# Patient Record
Sex: Female | Born: 1965
Health system: Southern US, Community
[De-identification: ages and names within clinical notes are randomized; demographics above are authoritative.]

## PROBLEM LIST (undated history)

## (undated) DIAGNOSIS — I10 Essential (primary) hypertension: Secondary | ICD-10-CM

## (undated) DIAGNOSIS — N9489 Other specified conditions associated with female genital organs and menstrual cycle: Secondary | ICD-10-CM

## (undated) DIAGNOSIS — E785 Hyperlipidemia, unspecified: Secondary | ICD-10-CM

## (undated) DIAGNOSIS — D219 Benign neoplasm of connective and other soft tissue, unspecified: Secondary | ICD-10-CM

## (undated) DIAGNOSIS — J302 Other seasonal allergic rhinitis: Secondary | ICD-10-CM

## (undated) DIAGNOSIS — E119 Type 2 diabetes mellitus without complications: Secondary | ICD-10-CM

## (undated) DIAGNOSIS — N950 Postmenopausal bleeding: Secondary | ICD-10-CM

## (undated) DIAGNOSIS — D573 Sickle-cell trait: Secondary | ICD-10-CM

## (undated) DIAGNOSIS — D259 Leiomyoma of uterus, unspecified: Secondary | ICD-10-CM

## (undated) DIAGNOSIS — R9389 Abnormal findings on diagnostic imaging of other specified body structures: Secondary | ICD-10-CM

## (undated) DIAGNOSIS — Z87410 Personal history of cervical dysplasia: Secondary | ICD-10-CM

## (undated) DIAGNOSIS — Z973 Presence of spectacles and contact lenses: Secondary | ICD-10-CM

## (undated) HISTORY — PX: DILATION AND CURETTAGE OF UTERUS: SHX78

## (undated) HISTORY — PX: LASER ABLATION OF THE CERVIX: SHX1949

## (undated) HISTORY — PX: TUBAL LIGATION: SHX77

---

## 2006-03-16 ENCOUNTER — Encounter: Admission: RE | Admit: 2006-03-16 | Discharge: 2006-03-16 | Payer: Self-pay | Admitting: Internal Medicine

## 2006-03-30 ENCOUNTER — Encounter: Admission: RE | Admit: 2006-03-30 | Discharge: 2006-03-30 | Payer: Self-pay | Admitting: Internal Medicine

## 2008-09-24 ENCOUNTER — Encounter: Admission: RE | Admit: 2008-09-24 | Discharge: 2008-09-24 | Payer: Self-pay | Admitting: Internal Medicine

## 2009-10-06 ENCOUNTER — Encounter: Admission: RE | Admit: 2009-10-06 | Discharge: 2009-10-06 | Payer: Self-pay | Admitting: Internal Medicine

## 2010-05-29 ENCOUNTER — Encounter: Payer: Self-pay | Admitting: Internal Medicine

## 2010-10-04 ENCOUNTER — Other Ambulatory Visit: Payer: Self-pay | Admitting: Internal Medicine

## 2010-10-04 DIAGNOSIS — Z1231 Encounter for screening mammogram for malignant neoplasm of breast: Secondary | ICD-10-CM

## 2010-10-14 ENCOUNTER — Ambulatory Visit
Admission: RE | Admit: 2010-10-14 | Discharge: 2010-10-14 | Disposition: A | Discharge status: Home / Self Care | Payer: Commercial Indemnity | Source: Ambulatory Visit | Attending: Internal Medicine | Admitting: Internal Medicine

## 2010-10-14 DIAGNOSIS — Z1231 Encounter for screening mammogram for malignant neoplasm of breast: Secondary | ICD-10-CM

## 2011-10-17 ENCOUNTER — Other Ambulatory Visit: Payer: Self-pay | Admitting: Internal Medicine

## 2011-10-17 DIAGNOSIS — Z1231 Encounter for screening mammogram for malignant neoplasm of breast: Secondary | ICD-10-CM

## 2011-10-25 ENCOUNTER — Ambulatory Visit
Admission: RE | Admit: 2011-10-25 | Discharge: 2011-10-25 | Disposition: A | Discharge status: Home / Self Care | Payer: Commercial Indemnity | Source: Ambulatory Visit | Attending: Internal Medicine | Admitting: Internal Medicine

## 2011-10-25 DIAGNOSIS — Z1231 Encounter for screening mammogram for malignant neoplasm of breast: Secondary | ICD-10-CM

## 2012-09-16 ENCOUNTER — Other Ambulatory Visit: Payer: Self-pay

## 2012-09-16 DIAGNOSIS — Z1231 Encounter for screening mammogram for malignant neoplasm of breast: Secondary | ICD-10-CM

## 2012-10-25 ENCOUNTER — Ambulatory Visit
Admission: RE | Admit: 2012-10-25 | Discharge: 2012-10-25 | Disposition: A | Discharge status: Home / Self Care | Payer: BC Managed Care – PPO | Source: Ambulatory Visit

## 2012-10-25 DIAGNOSIS — Z1231 Encounter for screening mammogram for malignant neoplasm of breast: Secondary | ICD-10-CM

## 2013-10-10 ENCOUNTER — Other Ambulatory Visit: Payer: Self-pay

## 2013-10-10 DIAGNOSIS — Z1231 Encounter for screening mammogram for malignant neoplasm of breast: Secondary | ICD-10-CM

## 2013-10-27 ENCOUNTER — Ambulatory Visit
Admission: RE | Admit: 2013-10-27 | Discharge: 2013-10-27 | Disposition: A | Discharge status: Home / Self Care | Payer: BC Managed Care – PPO | Source: Ambulatory Visit

## 2013-10-27 DIAGNOSIS — Z1231 Encounter for screening mammogram for malignant neoplasm of breast: Secondary | ICD-10-CM

## 2014-08-20 ENCOUNTER — Other Ambulatory Visit: Payer: Self-pay

## 2014-08-20 DIAGNOSIS — Z1231 Encounter for screening mammogram for malignant neoplasm of breast: Secondary | ICD-10-CM

## 2014-10-29 ENCOUNTER — Ambulatory Visit
Admission: RE | Admit: 2014-10-29 | Discharge: 2014-10-29 | Disposition: A | Discharge status: Home / Self Care | Payer: 59 | Source: Ambulatory Visit

## 2014-10-29 DIAGNOSIS — Z1231 Encounter for screening mammogram for malignant neoplasm of breast: Secondary | ICD-10-CM

## 2015-08-13 DIAGNOSIS — R7309 Other abnormal glucose: Secondary | ICD-10-CM | POA: Diagnosis not present

## 2015-08-13 DIAGNOSIS — M25551 Pain in right hip: Secondary | ICD-10-CM | POA: Diagnosis not present

## 2015-08-13 DIAGNOSIS — E559 Vitamin D deficiency, unspecified: Secondary | ICD-10-CM | POA: Diagnosis not present

## 2015-08-13 DIAGNOSIS — R030 Elevated blood-pressure reading, without diagnosis of hypertension: Secondary | ICD-10-CM | POA: Diagnosis not present

## 2015-10-01 DIAGNOSIS — J309 Allergic rhinitis, unspecified: Secondary | ICD-10-CM | POA: Diagnosis not present

## 2015-10-01 DIAGNOSIS — M25551 Pain in right hip: Secondary | ICD-10-CM | POA: Diagnosis not present

## 2015-10-01 DIAGNOSIS — R030 Elevated blood-pressure reading, without diagnosis of hypertension: Secondary | ICD-10-CM | POA: Diagnosis not present

## 2015-10-01 MED FILL — MELOXICAM 15 MG TABLET: 15 | 30 days supply | Qty: 30 | Fill #0

## 2015-10-01 MED FILL — SM FEXOFENADINE 180 MG TAB: 180 | 30 days supply | Qty: 30 | Fill #0

## 2015-10-01 MED FILL — HYDROCODON-APAP 5-325: 5-325 | 7 days supply | Qty: 40 | Fill #0

## 2015-10-14 ENCOUNTER — Other Ambulatory Visit: Payer: Self-pay | Admitting: Internal Medicine

## 2015-10-14 DIAGNOSIS — Z1231 Encounter for screening mammogram for malignant neoplasm of breast: Secondary | ICD-10-CM

## 2015-11-01 ENCOUNTER — Ambulatory Visit
Admission: RE | Admit: 2015-11-01 | Discharge: 2015-11-01 | Disposition: A | Discharge status: Home / Self Care | Payer: 59 | Source: Ambulatory Visit | Attending: Internal Medicine | Admitting: Internal Medicine

## 2015-11-01 DIAGNOSIS — Z1231 Encounter for screening mammogram for malignant neoplasm of breast: Secondary | ICD-10-CM | POA: Diagnosis not present

## 2016-05-04 DIAGNOSIS — Z6831 Body mass index (BMI) 31.0-31.9, adult: Secondary | ICD-10-CM | POA: Diagnosis not present

## 2016-05-04 DIAGNOSIS — Z01419 Encounter for gynecological examination (general) (routine) without abnormal findings: Secondary | ICD-10-CM | POA: Diagnosis not present

## 2016-05-11 DIAGNOSIS — D250 Submucous leiomyoma of uterus: Secondary | ICD-10-CM | POA: Diagnosis not present

## 2016-05-11 DIAGNOSIS — N921 Excessive and frequent menstruation with irregular cycle: Secondary | ICD-10-CM | POA: Diagnosis not present

## 2016-05-11 DIAGNOSIS — Z124 Encounter for screening for malignant neoplasm of cervix: Secondary | ICD-10-CM | POA: Diagnosis not present

## 2016-05-11 DIAGNOSIS — Z1151 Encounter for screening for human papillomavirus (HPV): Secondary | ICD-10-CM | POA: Diagnosis not present

## 2016-05-11 DIAGNOSIS — D252 Subserosal leiomyoma of uterus: Secondary | ICD-10-CM | POA: Diagnosis not present

## 2016-05-16 DIAGNOSIS — J069 Acute upper respiratory infection, unspecified: Secondary | ICD-10-CM | POA: Diagnosis not present

## 2016-05-17 MED FILL — AZITHROMYCIN 250 MG TABLET: 250 | 5 days supply | Qty: 6 | Fill #0

## 2016-05-18 DIAGNOSIS — J069 Acute upper respiratory infection, unspecified: Secondary | ICD-10-CM | POA: Diagnosis not present

## 2016-05-18 DIAGNOSIS — J111 Influenza due to unidentified influenza virus with other respiratory manifestations: Secondary | ICD-10-CM | POA: Diagnosis not present

## 2016-05-18 MED FILL — OSELTAMIVIR PHOS 75 MG CAP: 75 | 5 days supply | Qty: 10 | Fill #0

## 2016-06-02 DIAGNOSIS — N920 Excessive and frequent menstruation with regular cycle: Secondary | ICD-10-CM | POA: Diagnosis not present

## 2016-06-02 DIAGNOSIS — N921 Excessive and frequent menstruation with irregular cycle: Secondary | ICD-10-CM | POA: Diagnosis not present

## 2016-06-06 MED FILL — OSELTAMIVIR PHOS 75 MG CAP: 75 | 5 days supply | Qty: 10 | Fill #0

## 2016-06-19 ENCOUNTER — Other Ambulatory Visit: Payer: Self-pay | Admitting: Obstetrics and Gynecology

## 2016-06-19 DIAGNOSIS — D251 Intramural leiomyoma of uterus: Secondary | ICD-10-CM

## 2016-06-22 ENCOUNTER — Ambulatory Visit
Admission: RE | Admit: 2016-06-22 | Discharge: 2016-06-22 | Disposition: A | Discharge status: Home / Self Care | Payer: 59 | Source: Ambulatory Visit | Attending: Obstetrics and Gynecology | Admitting: Obstetrics and Gynecology

## 2016-06-22 DIAGNOSIS — D251 Intramural leiomyoma of uterus: Secondary | ICD-10-CM | POA: Diagnosis not present

## 2016-06-22 HISTORY — DX: Benign neoplasm of connective and other soft tissue, unspecified: D21.9

## 2016-06-22 HISTORY — PX: IR GENERIC HISTORICAL: IMG1180011

## 2016-06-22 NOTE — Consult Note (Signed)
Chief Complaint: Patient was seen in consultation today for  Chief Complaint  Patient presents with  . Advice Only    Consult for [place]     at the request of Cousins,Sheronette  Referring Physician(s): Cousins,Sheronette  History of Present Illness: Sandra Sims is a 51 y.o. female with known uterine fibroids by ultrasound. Patient complains of irregular menstrual cycles for the past 2 years. The irregular menstrual periods are associated with heavy bleeding and cramping. When she was having regular menstrual periods she was not complaining of menorrhagia or dysmenorrhea. In the past 2 years, the periods have become irregular and unpredictable. However, when the period starts, the bleeding will usually last 5 days with 2-3 days of heavy bleeding. She has mild cramping which she controls with ibuprofen. She does not have interperiod bleeding. She had a Pap smear on 05/11/2016 that was negative. Endometrial biopsy on 06/02/2016 was negative. No significant gynecologic infections except for a one yeast infection in the past. No significant bulk symptoms. Pregnancy history is G3, P2, SA 1. Patient had a tubal ligation.  Past Medical History:  Diagnosis Date  . Fibroid     No past surgical history on file.  Allergies: Patient has no known allergies.  Medications: Prior to Admission medications   Medication Sig Start Date End Date Taking? Authorizing Provider  Chlorphen-PE-Acetaminophen (NOREL AD) 4-10-325 MG TABS Take 1 tablet by mouth as needed.   Yes Historical Provider, MD  cholecalciferol (VITAMIN D) 1000 units tablet Take 1,000 Units by mouth daily.   Yes Historical Provider, MD  ibuprofen (ADVIL,MOTRIN) 800 MG tablet Take 800 mg by mouth every 8 (eight) hours as needed.   Yes Historical Provider, MD  Omega-3 Fatty Acids (FISH OIL PO) Take 1 tablet by mouth daily.   Yes Historical Provider, MD     No family history on file.  Social History   Social History  . Marital  status: Married    Spouse name: N/A  . Number of children: N/A  . Years of education: N/A   Social History Main Topics  . Smoking status: Not on file  . Smokeless tobacco: Not on file  . Alcohol use Not on file  . Drug use: Unknown  . Sexual activity: Not on file   Other Topics Concern  . Not on file   Social History Narrative  . No narrative on file   Review of Systems  Genitourinary: Positive for menstrual problem and pelvic pain.    Vital Signs: BP (!) 167/96 (BP Location: Left Arm, Patient Position: Sitting, Cuff Size: Normal)   Pulse 76   Temp 98.6 F (37 C) (Oral)   Resp (!) 99   Ht 5\' 4"  (1.626 m)   Wt 180 lb (81.6 kg)   LMP 06/21/2016 (Exact Date)   SpO2 (!) 15%   BMI 30.90 kg/m   Physical Exam  Constitutional: She appears well-developed.  Cardiovascular: Normal rate, regular rhythm, normal heart sounds and intact distal pulses.   Pulmonary/Chest: Effort normal and breath sounds normal.  Abdominal: Soft. She exhibits no distension. There is no tenderness. There is no guarding.         Imaging: Ultrasound report dated 05/11/2016: Uterine fibroids. Endometrial thickness 9 mm. Normal appearance of the ovaries. Endometrium is ill-defined with inhomogeneous uterine myometrium. Question adenomyosis. A few discrete fibroids noted. Largest fibroid measures up to 4.1 cm at the fundus.   Labs:  Endometrial biopsy 06/02/2016: Negative for proliferative myometrium. Negative for hyperplasia or  malignancy.   Pap smear 05/11/2016: Negative for intraepithelial lesion and malignancy.  CBC: No results for input(s): WBC, HGB, HCT, PLT in the last 8760 hours.  COAGS: No results for input(s): INR, APTT in the last 8760 hours.  BMP: No results for input(s): NA, K, CL, CO2, GLUCOSE, BUN, CALCIUM, CREATININE, GFRNONAA, GFRAA in the last 8760 hours.  Invalid input(s): CMP  LIVER FUNCTION TESTS: No results for input(s): BILITOT, AST, ALT, ALKPHOS, PROT, ALBUMIN in  the last 8760 hours.  TUMOR MARKERS: No results for input(s): AFPTM, CEA, CA199, CHROMGRNA in the last 8760 hours.  Assessment and Plan:  51 year-old female with uterine fibroids and irregular menstrual bleeding. Patient also complains of menorrhagia and mild dysmenorrhea. I discussed uterine fibroids with the patient. We discussed the uterine artery embolization procedure in depth. Explained to the patient that the embolization procedure has good results at decreasing menstrual bleeding and bulk symptoms. After further discussion, the patient said that the menorrhagia and dysmenorrhea was not bothering her that much. Her main complaint is related the irregularity and uncertainty of her menstrual periods. Based on the patient's age, I suspect that the irregularly could be related to perimenopausal changes. I would not necessarily expect uterine artery embolization procedure to make her menstrual periods more regular. I do think that a fibroid embolization could decrease the bleeding and help with the dysmenorrhea. We had a lengthy discussion about whether or not fibroid embolization would be the best thing for this patient. She would like to discuss her symptoms with her PCP and gynecologist. I do think that we could help with the menorrhagia and dysmenorrhea. If the patient feels that these symptoms are worth treating, then we will pursue a pelvic MRI. Patient will contact us in the near future.  Thank you for this interesting consult.  I greatly enjoyed meeting Sandra Sims and look forward to participating in their care.  A copy of this report was sent to the requesting provider on this date.  Electronically Signed: Carylon Perches 06/22/2016, 4:47 PM   I spent a total of  30 Minutes   in face to face in clinical consultation, greater than 50% of which was counseling/coordinating care for uterine fibroids.

## 2016-07-07 DIAGNOSIS — R030 Elevated blood-pressure reading, without diagnosis of hypertension: Secondary | ICD-10-CM | POA: Diagnosis not present

## 2016-07-07 DIAGNOSIS — J019 Acute sinusitis, unspecified: Secondary | ICD-10-CM | POA: Diagnosis not present

## 2016-07-07 MED FILL — AMOXICILLIN 875 MG TABLET: 875 | 10 days supply | Qty: 20 | Fill #0

## 2016-10-13 DIAGNOSIS — E559 Vitamin D deficiency, unspecified: Secondary | ICD-10-CM | POA: Diagnosis not present

## 2016-10-13 DIAGNOSIS — Z6831 Body mass index (BMI) 31.0-31.9, adult: Secondary | ICD-10-CM | POA: Diagnosis not present

## 2016-10-13 DIAGNOSIS — R51 Headache: Secondary | ICD-10-CM | POA: Diagnosis not present

## 2016-10-13 DIAGNOSIS — R7303 Prediabetes: Secondary | ICD-10-CM | POA: Diagnosis not present

## 2016-10-17 DIAGNOSIS — J309 Allergic rhinitis, unspecified: Secondary | ICD-10-CM | POA: Diagnosis not present

## 2016-10-26 ENCOUNTER — Other Ambulatory Visit: Payer: Self-pay | Admitting: Internal Medicine

## 2016-10-26 DIAGNOSIS — Z1231 Encounter for screening mammogram for malignant neoplasm of breast: Secondary | ICD-10-CM

## 2016-11-17 ENCOUNTER — Ambulatory Visit
Admission: RE | Admit: 2016-11-17 | Discharge: 2016-11-17 | Disposition: A | Discharge status: Home / Self Care | Payer: 59 | Source: Ambulatory Visit | Attending: Internal Medicine | Admitting: Internal Medicine

## 2016-11-17 DIAGNOSIS — Z1231 Encounter for screening mammogram for malignant neoplasm of breast: Secondary | ICD-10-CM

## 2016-11-20 DIAGNOSIS — R51 Headache: Secondary | ICD-10-CM | POA: Diagnosis not present

## 2016-11-20 DIAGNOSIS — R0982 Postnasal drip: Secondary | ICD-10-CM | POA: Diagnosis not present

## 2016-11-20 DIAGNOSIS — R0683 Snoring: Secondary | ICD-10-CM | POA: Diagnosis not present

## 2016-12-20 ENCOUNTER — Encounter: Payer: Self-pay | Admitting: Neurology

## 2016-12-20 ENCOUNTER — Ambulatory Visit (INDEPENDENT_AMBULATORY_CARE_PROVIDER_SITE_OTHER): Payer: 59 | Admitting: Neurology

## 2016-12-20 VITALS — BP 154/87 | HR 72 | Ht 64.0 in | Wt 186.0 lb

## 2016-12-20 DIAGNOSIS — Z9989 Dependence on other enabling machines and devices: Secondary | ICD-10-CM | POA: Diagnosis not present

## 2016-12-20 DIAGNOSIS — G4733 Obstructive sleep apnea (adult) (pediatric): Secondary | ICD-10-CM | POA: Diagnosis not present

## 2016-12-20 NOTE — Progress Notes (Signed)
SLEEP MEDICINE CLINIC   Provider:  Larey Seat, M D  Primary Care Physician:  Glendale Chard, MD   Referring Provider: Glendale Chard, MD   Chief Complaint  Patient presents with  . New Patient (Initial Visit)    referred for sleep, pt alone. pt states her husband and kids states she snores. pt was waking up in the middle of the night with horrible headaches. pt has had some relief with the headaches but the snoring has been a concern. pt sleeps about 4-5 hrs uninterupted and then if waking up to use the restroom she does not have difficulty falling back asleep. pt states most days she feels well rested.     HPI:  Sandra Sims is a 51 y.o. female , seen here as in a referral  from Dr. Baird Cancer for a sleep consultation.  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 She reports that headaches and snoring bother her and her husband-   Sleep habits are as follows:  She goes to bed between 10:30 and 11:30 PM on occasion later. Her bedroom is cool, quiet and dark. She shares a bedroom with her husband, she sleeps on her side but sometimes ends up on her back sleeping supine. She tries to sleep with a slightly elevated had on 2 pillows. Once asleep she will be sleeping for 4-5 hours and has to go to the bathroom at that time. Once back , she can usually fall asleep again. Her husband rises at 5:30 AM, and this often wakes her. She rises at 6 AM. Her husband believes at 6:30 and she will sometimes go back to bed for a short nap. Her work hours are from 8:30 06/12/2013, she has daylight access at her office.  Sleep medical history and family sleep history: migraine, headaches, she reported that her headaches were so severe they woke her from sleep. They have improved after she started some aromatherapy and magnesium supplements. she has a diagnosis of sickle cell trait, and that  she underwent a tubal ligation in 1998.   Social history:  The patient also is a former smoker former social alcohol drinker over 20 years ago. She is married with 2 children, college educated and now lives with herhusband and her youngest child ( son who is 64).  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   Review of Systems: Out of a complete 14 system review, the patient complains of only the following symptoms, and all other reviewed systems are negative.  Epworth score 7  , Fatigue severity score 16  , depression score 3/ 15    Social History   Social History  . Marital status: Married    Spouse name: N/A  . Number of children: N/A  . Years of education: N/A   Occupational History  . Not on file.   Social History Main Topics  . Smoking status: Former Smoker    Packs/day: 0.50  . Smokeless tobacco: Never Used  . Alcohol use Not on file  . Drug use: Unknown  . Sexual activity: Not on file   Other Topics Concern  . Not on file   Social History Narrative  . No narrative on file    No family history on file.  Past Medical History:  Diagnosis Date  . Fibroid     Past Surgical History:  Procedure Laterality Date  . IR GENERIC HISTORICAL  06/22/2016   IR RADIOLOGIST EVAL & MGMT 06/22/2016 [facility] INTERV RAD    Current Outpatient Prescriptions  Medication Sig Dispense Refill  . Chlorphen-PE-Acetaminophen (NOREL AD) 4-10-325 MG TABS Take 1 tablet by mouth as needed.    . cholecalciferol (VITAMIN D) 1000 units tablet Take 1,000 Units by mouth daily.    Marland Kitchen ibuprofen (ADVIL,MOTRIN) 800 MG tablet Take 800 mg by mouth every 8 (eight) hours as needed.    . magnesium oxide (MAG-OX) 400 MG tablet Take 400 mg by mouth daily.    . Omega-3 Fatty Acids (FISH OIL PO) Take 1,000 mg by mouth daily.      No current facility-administered medications for this visit.     Allergies as  of 12/20/2016  . (No Known Allergies)    Vitals: BP (!) 154/87   Pulse 72   Ht 5\' 4"  (1.626 m)   Wt 186 lb (84.4 kg)   BMI 31.93 kg/m  Last Weight:  Wt Readings from Last 1 Encounters:  12/20/16 186 lb (84.4 kg)   HKV:QQVZ mass index is 31.93 kg/m.     Last Height:   Ht Readings from Last 1 Encounters:  12/20/16 5\' 4"  (1.626 m)    Physical exam:  General: The patient is awake, alert and appears not in acute distress. The patient is well groomed. Head: Normocephalic, atraumatic. Neck is supple. Mallampati 4,  neck circumference:15, Nasal airflow patent ,  Retrognathia is not seen.  Cardiovascular:  Regular rate and rhythm , without  murmurs or carotid bruit, and without distended neck veins. Respiratory: Lungs are clear to auscultation. Skin:  Without evidence of edema, or rash Trunk: BMI is 32. The patient's posture is erect  Neurologic exam : The patient is awake and alert, oriented to place and time.   Cranial nerves: Pupils are equal and briskly reactive to light. Funduscopic exam without evidence of pallor or edema.Extraocular movements  in vertical and horizontal planes intact and without nystagmus. Visual fields by finger perimetry are intact.Hearing to finger rub intact. Facial sensation intact to fine touch. Facial motor strength is symmetric and tongue and uvula move midline. Shoulder shrug was symmetrical.   Motor exam:  Normal tone, muscle bulk and symmetric strength in all extremities. Sensory:  Fine touch, pinprick and vibration were normal. Coordination: Finger-to-nose maneuver  normal without evidence of ataxia, dysmetria or tremor. Gait and station: Patient walks without assistive device. Deep tendon reflexes: in the  upper and lower extremities are symmetric and intact.   Assessment:  After physical and neurologic examination, review of laboratory studies,  Personal review of imaging studies, reports of other /same  Imaging studies, results of polysomnography  and / or neurophysiology testing and pre-existing records as far as provided in visit., my assessment is   1) Sandra Sims has been witnessed to snore and she has become a little bit more fatigued in daytime, but she does not report excessive daytime sleepiness. There is no anecdotal evidence of sleep attacks. Her risk factors include African-American race, age, body mass index, and upper airway shape. It is her loud snoring that has bothered her family and also makes her feel uncomfortable. I do think that she should undergo a attended polysomnography, and we are looking forward to rule in or rule out the presence of apnea, if apnea is found it will be important to see if it  is associated with hypoxemia or with irregular heartbeats. If none of this is the case and the patient is either just a snorer or has uncomplicated mild-sleep apnea she may respond better to a dental device than CPAP.  2) headaches have resolved on magnesium.    The patient was advised of the nature of the diagnosed disorder , the treatment options and the  risks for general health and wellness arising from not treating the condition.   I spent more than 45 minutes of face to face time with the patient.  Greater than 50% of time was spent in counseling and coordination of care. We have discussed the diagnosis and differential and I answered the patient's questions.    Plan:  Treatment plan and additional workup :  SPLIT night , no CO2 needed.      Larey Seat, MD 9/48/0165, 5:37 PM  Certified in Neurology by ABPN Certified in [place] by [facility] [address], [place],  [address]

## 2017-01-10 ENCOUNTER — Ambulatory Visit (INDEPENDENT_AMBULATORY_CARE_PROVIDER_SITE_OTHER): Payer: 59 | Admitting: Neurology

## 2017-01-10 DIAGNOSIS — Z9989 Dependence on other enabling machines and devices: Principal | ICD-10-CM

## 2017-01-10 DIAGNOSIS — G4733 Obstructive sleep apnea (adult) (pediatric): Secondary | ICD-10-CM | POA: Diagnosis not present

## 2017-01-11 NOTE — Procedures (Signed)
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 Snoring, and episodic cluster headaches. Remote shift work history.  The patient endorsed the Epworth Sleepiness Scale at 7/24 points.   The patient's weight 186 pounds with a height of 64 (inches), resulting in a BMI of 31.6 kg/m2. The patient's neck circumference measured 15 inches.  CURRENT MEDICATIONS: Norel AD tabs, Vitamin D, Advil, Mag-Ox, Fish Oil.   PROCEDURE:  This is a multichannel digital polysomnogram utilizing the SomnoStar 11.2 system.  Electrodes and sensors were applied and monitored per AASM Specifications.   EEG, EOG, Chin and Limb EMG, were sampled at 200 Hz.  ECG, Snore and Nasal Pressure, Thermal Airflow, Respiratory Effort, CPAP Flow and Pressure, Oximetry was sampled at 50 Hz. Digital video and audio were recorded.      BASELINE STUDY : Lights Out was at 22:50 and Lights On at 05:20.  Total recording time (TRT) was 390 minutes, with a total sleep time (TST) of 324.5 minutes.  The patient's sleep latency was 50.5 minutes.  REM latency was only 49.5 minutes.  The sleep efficiency was 83.2 %.     SLEEP ARCHITECTURE: WASO (Wake after sleep onset) was 24.5 minutes.  There were 7 minutes in Stage N1, 145.5 minutes Stage N2, 80 minutes Stage N3 and 92 minutes in Stage REM.  The percentage of Stage N1 was 2.2%, Stage N2 was 44.8%, Stage N3 was 24.7% and Stage R (REM sleep) was 28.4%.   RESPIRATORY ANALYSIS:  There were a total of 30 respiratory events:  5 obstructive apneas, 13 central apneas and 0 mixed apneas with 12 hypopneas. The patient also had 0 respiratory event related arousals (RERAs).   The total APNEA/HYPOPNEA INDEX (AHI) was 5.5/hour and the total RESPIRATORY DISTURBANCE INDEX was 5.5  /hour.  18 events occurred in REM sleep and 11 events in NREM. The REM AHI was 11.7 /hour, versus a non-REM AHI of 3.1. The patient spent 141 minutes of total sleep time in the supine position and 184 minutes in non-supine. The supine AHI was 5.5 versus a non-supine AHI of 5.6.  OXYGEN SATURATION & C02:  The Wake baseline 02 saturation was 97%, with the lowest being 83%. Time spent below 89% saturation equaled 4 minutes. CO2:  No hypercapnia was found.    PERIODIC LIMB MOVEMENTS:  The patient had a total of 0 Periodic Limb Movements The arousals were noted as: 32 were spontaneous, 0 were associated with PLMs, and 3 were associated with respiratory events.   Audio and video analysis did not show any abnormal or unusual movements, behaviors, phonations or vocalizations.     No nocturia, moderate snoring, Regular NSR by EKG.   Post-study, the patient indicated that sleep was the same as usual.    IMPRESSION:  1. Insignificant degree of Obstructive Sleep Apnea (OSA) with an AHI of 5.5, not positional dependent but REM sleep accentuated.  2. Moderate snoring but no sleep related hypoxemia.  3. Remarkable amount of REM sleep!  RECOMMENDATIONS: CPAP is not indicated. Snoring can be addressed by weight loss, and by a dental device.  The patient's main sleep disorder are spontaneous arousals unrelated to physiological findings.   1. A follow up appointment will  be scheduled in the Sleep Clinic at [facility]. The referring provider will be notified of the results.      I certify that I have reviewed the entire raw data recording prior to the issuance of this report in accordance with the Standards of Accreditation of the American Academy of Sleep Medicine (AASM)     Larey Seat, MD      01-11-2017 Diplomat, American Board of Psychiatry and Neurology  Diplomat, American Board of [place] Director, [facility] [employer]

## 2017-01-16 ENCOUNTER — Telehealth: Payer: Self-pay | Admitting: Neurology

## 2017-01-16 NOTE — Telephone Encounter (Signed)
Called and discussed sleep study results and made pt aware that she did not have sleep apnea. Pt verbalized understanding.

## 2017-01-16 NOTE — Telephone Encounter (Signed)
-----   Message from Larey Seat, MD sent at 01/11/2017  5:15 PM EDT ----- Insignificant apnea, with moderate snoring, no hypoxemia or hypercapnia that could explain headaches. I can offer to refer for snoring treatment( Mild OSA and snoring) for a dental device, a CPAP is not indicated. Weight loss would be beneficial. No intervention needed otherwise. Will meet and discuss,  CD  Cc Dr Baird Cancer

## 2017-01-17 DIAGNOSIS — R0982 Postnasal drip: Secondary | ICD-10-CM | POA: Diagnosis not present

## 2017-01-17 DIAGNOSIS — Z6832 Body mass index (BMI) 32.0-32.9, adult: Secondary | ICD-10-CM | POA: Diagnosis not present

## 2017-01-17 DIAGNOSIS — R7303 Prediabetes: Secondary | ICD-10-CM | POA: Diagnosis not present

## 2017-06-05 ENCOUNTER — Encounter: Payer: Self-pay | Admitting: Internal Medicine

## 2017-06-15 MED FILL — MOMETASONE FUROATE 50 MCG S: 50 | 30 days supply | Qty: 17 | Fill #0

## 2017-06-18 MED FILL — AZITHROMYCIN 250 MG TABLET: 250 | 5 days supply | Qty: 6 | Fill #0

## 2017-07-02 MED FILL — HYDROCHLOROTHIAZIDE 12.5 MG: 12.5 | 30 days supply | Qty: 30 | Fill #0

## 2017-07-02 MED FILL — CYCLOBENZAPRINE 10 MG TAB: 10 | 30 days supply | Qty: 30 | Fill #0

## 2017-07-12 ENCOUNTER — Other Ambulatory Visit: Payer: Self-pay

## 2017-07-12 ENCOUNTER — Ambulatory Visit (AMBULATORY_SURGERY_CENTER): Payer: Self-pay

## 2017-07-12 VITALS — Ht 64.0 in | Wt 186.6 lb

## 2017-07-12 DIAGNOSIS — Z1211 Encounter for screening for malignant neoplasm of colon: Secondary | ICD-10-CM

## 2017-07-12 MED ORDER — NA SULFATE-K SULFATE-MG SULF 17.5-3.13-1.6 GM/177ML PO SOLN
1.0000 | Freq: Once | ORAL | 0 refills | Status: AC
Start: 2017-07-12 — End: 2017-07-12

## 2017-07-12 MED FILL — SUPREP BOWEL PREP KIT: 17.5-3.13-1 | 1 days supply | Qty: 354 | Fill #0

## 2017-07-12 NOTE — Progress Notes (Signed)
No egg or soy allergy known to patient  No issues with past sedation with any surgeries  or procedures, no intubation problems  No diet pills per patient No home 02 use per patient  No blood thinners per patient  Pt denies issues with constipation  No A fib or A flutter  EMMI video sent to pt's e mail  

## 2017-07-26 ENCOUNTER — Other Ambulatory Visit: Payer: Self-pay

## 2017-07-26 ENCOUNTER — Ambulatory Visit (AMBULATORY_SURGERY_CENTER): Payer: No Typology Code available for payment source | Admitting: Internal Medicine

## 2017-07-26 ENCOUNTER — Encounter: Payer: Self-pay | Admitting: Internal Medicine

## 2017-07-26 VITALS — BP 136/92 | HR 78 | Temp 98.6°F | Resp 12 | Ht 64.0 in | Wt 186.0 lb

## 2017-07-26 DIAGNOSIS — Z1212 Encounter for screening for malignant neoplasm of rectum: Secondary | ICD-10-CM | POA: Diagnosis not present

## 2017-07-26 DIAGNOSIS — Z1211 Encounter for screening for malignant neoplasm of colon: Secondary | ICD-10-CM | POA: Diagnosis present

## 2017-07-26 HISTORY — PX: COLONOSCOPY WITH PROPOFOL: SHX5780

## 2017-07-26 MED ORDER — SODIUM CHLORIDE 0.9 % IV SOLN: 500.0000 mL | Freq: Once | INTRAVENOUS | Status: DC

## 2017-07-26 NOTE — Op Note (Signed)
[place] Patient Name: Sandra Sims Procedure Date: 07/26/2017 1:34 PM MRN: [id number] Endoscopist: Jerene Bears , MD Age: 52 Referring MD:  Date of Birth: 02/05/1966 Gender: Female Account #: [id number] Procedure:                Colonoscopy Indications:              Screening for colorectal malignant neoplasm, This                            is the patient's first colonoscopy Medicines:                Monitored Anesthesia Care Procedure:                Pre-Anesthesia Assessment:                           - Prior to the procedure, a History and Physical                            was performed, and patient medications and                            allergies were reviewed. The patient's tolerance of                            previous anesthesia was also reviewed. The risks                            and benefits of the procedure and the sedation                            options and risks were discussed with the patient.                            All questions were answered, and informed consent                            was obtained. Prior Anticoagulants: The patient has                            taken no previous anticoagulant or antiplatelet                            agents. ASA Grade Assessment: II - A patient with                            mild systemic disease. After reviewing the risks                            and benefits, the patient was deemed in                            satisfactory condition to undergo the procedure.  After obtaining informed consent, the colonoscope                            was passed under direct vision. Throughout the                            procedure, the patient's blood pressure, pulse, and                            oxygen saturations were monitored continuously. The                            Colonoscope was introduced through the anus and                            advanced to the the cecum,  identified by                            appendiceal orifice and ileocecal valve. The                            colonoscopy was performed without difficulty. The                            patient tolerated the procedure well. The quality                            of the bowel preparation was good. The ileocecal                            valve, appendiceal orifice, and rectum were                            photographed. Scope In: 1:39:06 PM Scope Out: 1:50:27 PM Scope Withdrawal Time: 0 hours 7 minutes 24 seconds  Total Procedure Duration: 0 hours 11 minutes 21 seconds  Findings:                 The digital rectal exam was normal.                           A few small-mouthed diverticula were found in the                            sigmoid colon, hepatic flexure and ascending colon.                           The exam was otherwise without abnormality on                            direct and retroflexion views. Complications:            No immediate complications. Estimated Blood Loss:     Estimated blood loss: none. Impression:               - Mild diverticulosis in  the sigmoid colon, at the                            hepatic flexure and in the ascending colon.                           - The examination was otherwise normal on direct                            and retroflexion views.                           - No specimens collected. Recommendation:           - Patient has a contact number available for                            emergencies. The signs and symptoms of potential                            delayed complications were discussed with the                            patient. Return to normal activities tomorrow.                            Written discharge instructions were provided to the                            patient.                           - Resume previous diet.                           - Continue present medications.                           - Repeat  colonoscopy in 10 years for screening                            purposes. Jerene Bears, MD 07/26/2017 1:53:10 PM This report has been signed electronically.

## 2017-07-26 NOTE — Patient Instructions (Signed)

## 2017-07-26 NOTE — Progress Notes (Signed)
Pt's states no medical or surgical changes since previsit or office visit. 

## 2017-07-27 ENCOUNTER — Telehealth: Payer: Self-pay | Admitting: *Deleted

## 2017-07-27 NOTE — Telephone Encounter (Signed)
  Follow up Call-  Call back number 07/26/2017  Post procedure Call Back phone  # [phone] cell  Permission to leave phone message Yes  Some recent data might be hidden     Patient questions:  Do you have a fever, pain , or abdominal swelling? No. Pain Score  0 *  Have you tolerated food without any problems? Yes.    Have you been able to return to your normal activities? Yes.    Do you have any questions about your discharge instructions: Diet   No. Medications  No. Follow up visit  No.  Do you have questions or concerns about your Care? No.  Actions: * If pain score is 4 or above: No action needed, pain <4.

## 2017-08-02 MED FILL — HYDROCHLOROTHIAZIDE 12.5 MG: 12.5 | 30 days supply | Qty: 30 | Fill #0

## 2017-08-30 MED FILL — HYDROCHLOROTHIAZIDE 12.5 MG: 12.5 | 90 days supply | Qty: 90 | Fill #0

## 2017-10-02 MED FILL — AMOXICILLIN 500 MG CAPSULE: 500 | 7 days supply | Qty: 21 | Fill #0

## 2017-10-24 ENCOUNTER — Other Ambulatory Visit: Payer: Self-pay | Admitting: Internal Medicine

## 2017-10-24 DIAGNOSIS — Z1231 Encounter for screening mammogram for malignant neoplasm of breast: Secondary | ICD-10-CM

## 2017-11-07 MED FILL — NYSTATIN 100,000 UNIT/GM CR: 100000 | 15 days supply | Qty: 60 | Fill #0

## 2017-11-07 MED FILL — NYAMYC 100,000 UNITS/GM PWD: 100000 | 30 days supply | Qty: 60 | Fill #0

## 2017-11-19 ENCOUNTER — Ambulatory Visit
Admission: RE | Admit: 2017-11-19 | Discharge: 2017-11-19 | Disposition: A | Discharge status: Home / Self Care | Payer: No Typology Code available for payment source | Source: Ambulatory Visit | Attending: Internal Medicine | Admitting: Internal Medicine

## 2017-11-19 DIAGNOSIS — Z1231 Encounter for screening mammogram for malignant neoplasm of breast: Secondary | ICD-10-CM

## 2017-11-29 MED FILL — HYDROCHLOROTHIAZIDE 12.5 MG: 12.5 | 90 days supply | Qty: 90 | Fill #0

## 2018-01-06 DIAGNOSIS — G4733 Obstructive sleep apnea (adult) (pediatric): Secondary | ICD-10-CM

## 2018-01-06 HISTORY — DX: Obstructive sleep apnea (adult) (pediatric): G47.33

## 2018-01-11 LAB — HM DIABETES EYE EXAM

## 2018-03-06 ENCOUNTER — Encounter: Payer: Self-pay | Admitting: Internal Medicine

## 2018-03-06 ENCOUNTER — Ambulatory Visit (INDEPENDENT_AMBULATORY_CARE_PROVIDER_SITE_OTHER): Payer: No Typology Code available for payment source | Admitting: Internal Medicine

## 2018-03-06 VITALS — BP 114/76 | HR 97 | Temp 98.4°F | Ht 64.0 in | Wt 177.2 lb

## 2018-03-06 DIAGNOSIS — I10 Essential (primary) hypertension: Secondary | ICD-10-CM | POA: Diagnosis not present

## 2018-03-06 DIAGNOSIS — E7800 Pure hypercholesterolemia, unspecified: Secondary | ICD-10-CM | POA: Insufficient documentation

## 2018-03-06 DIAGNOSIS — Z6830 Body mass index (BMI) 30.0-30.9, adult: Secondary | ICD-10-CM | POA: Diagnosis not present

## 2018-03-06 DIAGNOSIS — E1165 Type 2 diabetes mellitus with hyperglycemia: Secondary | ICD-10-CM

## 2018-03-06 DIAGNOSIS — E785 Hyperlipidemia, unspecified: Secondary | ICD-10-CM | POA: Insufficient documentation

## 2018-03-06 NOTE — Progress Notes (Signed)
Subjective:     Patient ID: Sandra Sims , female    DOB: 1965-10-28 , 52 y.o.   MRN: [id number]   Chief Complaint  Patient presents with  . Diabetes  . Hypertension    HPI  Diabetes  She presents for her follow-up diabetic visit. She has type 2 diabetes mellitus. Her disease course has been stable. There are no hypoglycemic associated symptoms. There are no diabetic associated symptoms. There are no hypoglycemic complications. Symptoms are stable. There are no diabetic complications.  Hypertension  This is a chronic problem. The current episode started more than 1 month ago. The problem has been rapidly improving since onset. The problem is controlled.   She reports compliance with meds.   Past Medical History:  Diagnosis Date  . Fibroid       Current Outpatient Medications:  .  BLACK CURRANT SEED OIL PO, Take by mouth. 1 tsp daily, Disp: , Rfl:  .  Chlorphen-PE-Acetaminophen (NOREL AD) 4-10-325 MG TABS, Take 1 tablet by mouth as needed., Disp: , Rfl:  .  cholecalciferol (VITAMIN D) 1000 units tablet, Take 1,000 Units by mouth daily., Disp: , Rfl:  .  cyclobenzaprine (FLEXERIL) 10 MG tablet, Take 10 mg by mouth at bedtime as needed., Disp: , Rfl: 0 .  hydrochlorothiazide (MICROZIDE) 12.5 MG capsule, Take 12.5 mg by mouth daily., Disp: , Rfl: 0 .  ibuprofen (ADVIL,MOTRIN) 800 MG tablet, Take 800 mg by mouth every 8 (eight) hours as needed., Disp: , Rfl:  .  magnesium oxide (MAG-OX) 400 MG tablet, Take 400 mg by mouth daily., Disp: , Rfl:  .  mometasone (NASONEX) 50 MCG/ACT nasal spray, 2 sprays daily., Disp: , Rfl: 2 .  Omega-3 Fatty Acids (FISH OIL PO), Take 1,000 mg by mouth daily. , Disp: , Rfl:  .  OVER THE COUNTER MEDICATION, Apple cider vinegar 2 tsp daily, Disp: , Rfl:  .  vitamin C (ASCORBIC ACID) 500 MG tablet, Take 500 mg by mouth daily., Disp: , Rfl:    No Known Allergies   Review of Systems  Constitutional: Negative.   HENT: Negative.   Respiratory:  Negative.   Cardiovascular: Negative.   Gastrointestinal: Negative.   Neurological: Negative.   Psychiatric/Behavioral: Negative.      Today's Vitals   03/06/18 0903  BP: 114/76  Pulse: 97  Temp: 98.4 F (36.9 C)  TempSrc: Oral  Weight: 177 lb 3.2 oz (80.4 kg)  Height: _0  (1.626 m)   Body mass index is 30.42 kg/m.   Objective:  Physical Exam  Constitutional: She is oriented to person, place, and time. She appears well-developed and well-nourished.  HENT:  Head: Normocephalic and atraumatic.  Eyes: EOM are normal.  Cardiovascular: Normal rate, regular rhythm and normal heart sounds.  Pulmonary/Chest: Effort normal and breath sounds normal.  Neurological: She is alert and oriented to person, place, and time.  Skin: Skin is warm and dry.  Psychiatric: She has a normal mood and affect.  Nursing note and vitals reviewed.       Assessment And Plan:     1. Uncontrolled type 2 diabetes mellitus with hyperglycemia ([place])  I will check labs as listed below.  She will continue with Ozempic. She will rto in four months for re-evaluation. She is encouraged to exercise no less than five days weekly.   - Lipid Profile - CMP14+EGFR - Hemoglobin A1c - HIV antibody (with reflex)  2. Essential hypertension, benign  Well controlled. She will continue with current  meds. She is encouraged to limit her salt intake.   3. Pure hypercholesterolemia  Previous lipids were reviewed (in [facility]). I will check a fasting lipid panel today. She is encouraged to limit her fried food intake, exercise regularly and eat fish no less than twice weekly.   4. Adult BMI 30.0-30.9 kg/sq m  She is encouraged to strive for BMI less than 27 to decrease cardiac risk. She was congratulated on her weight loss thus far.   [name], MD

## 2018-03-06 NOTE — Patient Instructions (Signed)

## 2018-03-07 LAB — LIPID PANEL
Chol/HDL Ratio: 4 ratio (ref 0.0–4.4)
Cholesterol, Total: 244 mg/dL — ABNORMAL HIGH (ref 100–199)
HDL: 61 mg/dL (ref >39)
LDL Calculated: 168 mg/dL — ABNORMAL HIGH (ref 0–99)
Triglycerides: 76 mg/dL (ref 0–149)
VLDL Cholesterol Cal: 15 mg/dL (ref 5–40)

## 2018-03-07 LAB — CMP14+EGFR
ALBUMIN: 4.4 g/dL (ref 3.5–5.5)
ALK PHOS: 72 IU/L (ref 39–117)
ALT: 15 IU/L (ref 0–32)
AST: 15 IU/L (ref 0–40)
Albumin/Globulin Ratio: 1.4 (ref 1.2–2.2)
BUN/Creatinine Ratio: 17 (ref 9–23)
BUN: 13 mg/dL (ref 6–24)
Bilirubin Total: 0.7 mg/dL (ref 0.0–1.2)
CO2: 29 mmol/L (ref 20–29)
CREATININE: 0.75 mg/dL (ref 0.57–1.00)
Calcium: 10.1 mg/dL (ref 8.7–10.2)
Chloride: 99 mmol/L (ref 96–106)
GFR calc non Af Amer: 93 mL/min/{1.73_m2} (ref >59)
GFR, EST AFRICAN AMERICAN: 107 mL/min/{1.73_m2} (ref >59)
GLUCOSE: 95 mg/dL (ref 65–99)
Globulin, Total: 3.2 g/dL (ref 1.5–4.5)
Potassium: 4.1 mmol/L (ref 3.5–5.2)
Sodium: 143 mmol/L (ref 134–144)
TOTAL PROTEIN: 7.6 g/dL (ref 6.0–8.5)

## 2018-03-07 LAB — HIV ANTIBODY (ROUTINE TESTING W REFLEX): HIV SCREEN 4TH GENERATION: NONREACTIVE

## 2018-03-07 LAB — HEMOGLOBIN A1C
Est. average glucose Bld gHb Est-mCnc: 131 mg/dL
HEMOGLOBIN A1C: 6.2 % — AB (ref 4.8–5.6)

## 2018-03-07 NOTE — Progress Notes (Signed)
Here are your lab results:  Your LDL, bad cholesterol is 168. Ideally, as a diabetic, this should be less than 70.   Please be sure to exercise no less than five days weekly, avoid fried foods and take a fiber supplement - like Benefiber once daily. Standard of care suggests you start a cholesterol medication, specifically a statin to treat this. Are you willing to start a prescription cholesterol medication?   Your liver and kidney function are normal. Your hba1c is 6.2, this Is pretty good! Please continue with Ozempic. Let me know if you have any questions.   Sincerely,    Josuha Fontanez N. [facility], MD

## 2018-03-14 MED FILL — HYDROCHLOROTHIAZIDE 12.5 MG: 12.5 | 90 days supply | Qty: 90 | Fill #1

## 2018-04-26 ENCOUNTER — Encounter: Payer: Self-pay | Admitting: Nurse Practitioner

## 2018-04-26 ENCOUNTER — Ambulatory Visit (INDEPENDENT_AMBULATORY_CARE_PROVIDER_SITE_OTHER): Payer: No Typology Code available for payment source | Admitting: Nurse Practitioner

## 2018-04-26 VITALS — BP 156/88 | HR 94 | Temp 98.9°F | Ht 64.0 in | Wt 184.0 lb

## 2018-04-26 DIAGNOSIS — J069 Acute upper respiratory infection, unspecified: Secondary | ICD-10-CM | POA: Diagnosis not present

## 2018-04-26 DIAGNOSIS — I10 Essential (primary) hypertension: Secondary | ICD-10-CM | POA: Diagnosis not present

## 2018-04-26 NOTE — Progress Notes (Signed)
Subjective:     Patient ID: Sandra Sims , female    DOB: [birth year] , 52 y.o.   MRN: [id number]   Chief Complaint  Patient presents with  . cold symptoms    HPI  URI   This is a new problem. The current episode started yesterday. The problem has been gradually worsening. There has been no fever. Associated symptoms include congestion and headaches. Pertinent negatives include no abdominal pain, chest pain, diarrhea, ear pain, nausea or sore throat. She has tried decongestant (Norel AD and 2 doses of Nyquil) for the symptoms. The treatment provided mild relief.     Past Medical History:  Diagnosis Date  . Fibroid      Family History  Problem Relation Age of Onset  . Colon polyps Mother   . Stroke Father   . Colon cancer Neg Hx   . Esophageal cancer Neg Hx   . Rectal cancer Neg Hx   . Stomach cancer Neg Hx   . Liver disease Neg Hx   . Pancreatic cancer Neg Hx   . Prostate cancer Neg Hx      Current Outpatient Medications:  .  BLACK CURRANT SEED OIL PO, Take by mouth. 1 tsp daily, Disp: , Rfl:  .  Chlorphen-PE-Acetaminophen (NOREL AD) 4-10-325 MG TABS, Take 1 tablet by mouth as needed., Disp: , Rfl:  .  Chlorphen-Phenyleph-ASA (ALKA-SELTZER PLUS COLD PO), Take by mouth., Disp: , Rfl:  .  cholecalciferol (VITAMIN D) 1000 units tablet, Take 1,000 Units by mouth daily., Disp: , Rfl:  .  cyclobenzaprine (FLEXERIL) 10 MG tablet, Take 10 mg by mouth at bedtime as needed., Disp: , Rfl: 0 .  hydrochlorothiazide (MICROZIDE) 12.5 MG capsule, Take 12.5 mg by mouth daily., Disp: , Rfl: 0 .  ibuprofen (ADVIL,MOTRIN) 800 MG tablet, Take 800 mg by mouth every 8 (eight) hours as needed., Disp: , Rfl:  .  magnesium oxide (MAG-OX) 400 MG tablet, Take 400 mg by mouth daily., Disp: , Rfl:  .  mometasone (NASONEX) 50 MCG/ACT nasal spray, 2 sprays daily., Disp: , Rfl: 2 .  Omega-3 Fatty Acids (FISH OIL PO), Take 1,000 mg by mouth daily. , Disp: , Rfl:  .  OVER THE COUNTER MEDICATION, Apple  cider vinegar 2 tsp daily, Disp: , Rfl:  .  vitamin C (ASCORBIC ACID) 500 MG tablet, Take 500 mg by mouth daily., Disp: , Rfl:    No Known Allergies   Review of Systems  Constitutional: Positive for fatigue. Negative for fever.  HENT: Positive for congestion. Negative for ear pain and sore throat.   Eyes: Negative.   Respiratory: Negative for apnea and chest tightness.   Cardiovascular: Negative for chest pain, palpitations and leg swelling.  Gastrointestinal: Negative for abdominal pain, diarrhea and nausea.  Neurological: Positive for headaches. Negative for dizziness.     Today's Vitals   04/26/18 1228  BP: (!) 156/88  Pulse: 94  Temp: 98.9 F (37.2 C)  TempSrc: Oral  SpO2: 98%  Weight: 184 lb (83.5 kg)  Height: 5\' 4"  (1.626 m)   Body mass index is 31.58 kg/m.   Objective:  Physical Exam HENT:     Head: Normocephalic.     Right Ear: Tympanic membrane, ear canal and external ear normal.     Left Ear: Tympanic membrane, ear canal and external ear normal.     Nose: Nose normal.     Mouth/Throat:     Mouth: Mucous membranes are moist.  Pharynx: Posterior oropharyngeal erythema present. No oropharyngeal exudate.  Eyes:     Pupils: Pupils are equal, round, and reactive to light.  Neck:     Musculoskeletal: Normal range of motion and neck supple.  Cardiovascular:     Rate and Rhythm: Normal rate.     Pulses: Normal pulses.     Heart sounds: Normal heart sounds. No murmur.  Pulmonary:     Effort: Pulmonary effort is normal.     Breath sounds: Normal breath sounds. No wheezing.  Neurological:     General: No focal deficit present.  Psychiatric:        Mood and Affect: Mood normal.         Assessment And Plan:      1. Upper respiratory tract infection, unspecified type  Will treat with rocephin does better with injections and adherence compared to oral agents - cefTRIAXone (ROCEPHIN) injection 1 g   2. Essential hypertension, benign  Discussed avoiding  medications for colds that cause an elevated blood pressure  Advised to take HBP coricidan brand medications for cold symptoms      Minette Brine, FNP

## 2018-04-26 NOTE — Patient Instructions (Signed)
TAKE HBP CORICIDAN BRAND MEDICATIONS FOR COLD SYMPTOMS   Upper Respiratory Infection, Adult An upper respiratory infection (URI) affects the nose, throat, and upper air passages. URIs are caused by germs (viruses). The most common type of URI is often called "the common cold." Medicines cannot cure URIs, but you can do things at home to relieve your symptoms. URIs usually get better within 7-10 days. Follow these instructions at home: Activity  Rest as needed.  If you have a fever, stay home from work or school until your fever is gone, or until your doctor says you may return to work or school. ? You should stay home until you cannot spread the infection anymore (you are not contagious). ? Your doctor may have you wear a face mask so you have less risk of spreading the infection. Relieving symptoms  Gargle with a salt-water mixture 3-4 times a day or as needed. To make a salt-water mixture, completely dissolve -1 tsp of salt in 1 cup of warm water.  Use a cool-mist humidifier to add moisture to the air. This can help you breathe more easily. Eating and drinking   Drink enough fluid to keep your pee (urine) pale yellow.  Eat soups and other clear broths. General instructions   Take over-the-counter and prescription medicines only as told by your doctor. These include cold medicines, fever reducers, and cough suppressants.  Do not use any products that contain nicotine or tobacco. These include cigarettes and e-cigarettes. If you need help quitting, ask your doctor.  Avoid being where people are smoking (avoid secondhand smoke).  Make sure you get regular shots and get the flu shot every year.  Keep all follow-up visits as told by your doctor. This is important. How to avoid spreading infection to others   Wash your hands often with soap and water. If you do not have soap and water, use hand sanitizer.  Avoid touching your mouth, face, eyes, or nose.  Cough or sneeze  into a tissue or your sleeve or elbow. Do not cough or sneeze into your hand or into the air. Contact a doctor if:  You are getting worse, not better.  You have any of these: ? A fever. ? Chills. ? Brown or red mucus in your nose. ? Yellow or brown fluid (discharge)coming from your nose. ? Pain in your face, especially when you bend forward. ? Swollen neck glands. ? Pain with swallowing. ? White areas in the back of your throat. Get help right away if:  You have shortness of breath that gets worse.  You have very bad or constant: ? Headache. ? Ear pain. ? Pain in your forehead, behind your eyes, and over your cheekbones (sinus pain). ? Chest pain.  You have long-lasting (chronic) lung disease along with any of these: ? Wheezing. ? Long-lasting cough. ? Coughing up blood. ? A change in your usual mucus.  You have a stiff neck.  You have changes in your: ? Vision. ? Hearing. ? Thinking. ? Mood. Summary  An upper respiratory infection (URI) is caused by a germ called a virus. The most common type of URI is often called "the common cold."  URIs usually get better within 7-10 days.  Take over-the-counter and prescription medicines only as told by your doctor. This information is not intended to replace advice given to you by your health care provider. Make sure you discuss any questions you have with your health care provider. Document Released: 10/11/2007 Document Revised: 12/15/2016  Document Reviewed: 12/15/2016 Elsevier Interactive Patient Education  [employer].

## 2018-04-30 MED ORDER — CEFTRIAXONE SODIUM 1 G IJ SOLR: 1.0000 g | Freq: Once | INTRAMUSCULAR | Status: DC

## 2018-06-24 ENCOUNTER — Ambulatory Visit (INDEPENDENT_AMBULATORY_CARE_PROVIDER_SITE_OTHER): Payer: No Typology Code available for payment source | Admitting: Internal Medicine

## 2018-06-24 ENCOUNTER — Encounter: Payer: Self-pay | Admitting: Internal Medicine

## 2018-06-24 VITALS — BP 128/74 | HR 72 | Temp 98.4°F | Ht 64.0 in | Wt 183.0 lb

## 2018-06-24 DIAGNOSIS — M62838 Other muscle spasm: Secondary | ICD-10-CM

## 2018-06-24 DIAGNOSIS — M545 Low back pain, unspecified: Secondary | ICD-10-CM

## 2018-06-24 LAB — POCT URINALYSIS DIPSTICK
Bilirubin, UA: NEGATIVE
Glucose, UA: NEGATIVE
Ketones, UA: NEGATIVE
Leukocytes, UA: NEGATIVE
Nitrite, UA: NEGATIVE
PH UA: 7.5 (ref 5.0–8.0)
Protein, UA: NEGATIVE
Spec Grav, UA: 1.015 (ref 1.010–1.025)
Urobilinogen, UA: 0.2 E.U./dL

## 2018-06-24 MED ORDER — KETOROLAC TROMETHAMINE 60 MG/2ML IM SOLN
60.0000 mg | Freq: Once | INTRAMUSCULAR | Status: AC
  Administered 2018-06-24: 60 mg via INTRAMUSCULAR

## 2018-06-24 NOTE — Progress Notes (Signed)
Subjective:     Patient ID: Sandra Sims , female    DOB: 09-Oct-1965 , 53 y.o.   MRN: [id number]   Chief Complaint  Patient presents with  . Back Pain    HPI  Back Pain  This is a new problem. The current episode started today. The problem occurs constantly. The problem has been gradually worsening since onset. The pain is present in the lumbar spine. The quality of the pain is described as shooting. The pain does not radiate. The pain is at a severity of 10/10. The pain is severe. The symptoms are aggravated by lying down, sitting and standing. Pertinent negatives include no leg pain, numbness, paresthesias, tingling or weakness. She has tried nothing for the symptoms.      Past Medical History:  Diagnosis Date  . Fibroid      Family History  Problem Relation Age of Onset  . Colon polyps Mother   . Stroke Father   . Colon cancer Neg Hx   . Esophageal cancer Neg Hx   . Rectal cancer Neg Hx   . Stomach cancer Neg Hx   . Liver disease Neg Hx   . Pancreatic cancer Neg Hx   . Prostate cancer Neg Hx      Current Outpatient Medications:  .  BLACK CURRANT SEED OIL PO, Take by mouth. 1 tsp daily, Disp: , Rfl:  .  Chlorphen-PE-Acetaminophen (NOREL AD) 4-10-325 MG TABS, Take 1 tablet by mouth as needed., Disp: , Rfl:  .  Chlorphen-Phenyleph-ASA (ALKA-SELTZER PLUS COLD PO), Take by mouth., Disp: , Rfl:  .  cholecalciferol (VITAMIN D) 1000 units tablet, Take 1,000 Units by mouth daily., Disp: , Rfl:  .  hydrochlorothiazide (MICROZIDE) 12.5 MG capsule, Take 12.5 mg by mouth daily., Disp: , Rfl: 0 .  magnesium oxide (MAG-OX) 400 MG tablet, Take 400 mg by mouth daily., Disp: , Rfl:  .  mometasone (NASONEX) 50 MCG/ACT nasal spray, 2 sprays daily., Disp: , Rfl: 2 .  Omega-3 Fatty Acids (FISH OIL PO), Take 1,000 mg by mouth daily. , Disp: , Rfl:  .  OVER THE COUNTER MEDICATION, Apple cider vinegar 2 tsp daily, Disp: , Rfl:  .  vitamin C (ASCORBIC ACID) 500 MG tablet, Take 500 mg by  mouth daily., Disp: , Rfl:  .  cyclobenzaprine (FLEXERIL) 10 MG tablet, Take 10 mg by mouth at bedtime as needed., Disp: , Rfl: 0 .  ibuprofen (ADVIL,MOTRIN) 800 MG tablet, Take 800 mg by mouth every 8 (eight) hours as needed., Disp: , Rfl:    No Known Allergies   Review of Systems  Constitutional: Negative.   Respiratory: Negative.   Cardiovascular: Negative.   Gastrointestinal: Negative.   Genitourinary: Negative.   Musculoskeletal: Positive for back pain.  Neurological: Negative for tingling, weakness, numbness and paresthesias.  Psychiatric/Behavioral: Negative.      Today's Vitals   06/24/18 0910  BP: 128/74  Pulse: 72  Temp: 98.4 F (36.9 C)  Weight: 183 lb (83 kg)  Height: 5\' 4"  (1.626 m)   Body mass index is 31.41 kg/m.   Objective:  Physical Exam Vitals signs and nursing note reviewed.  Constitutional:      Appearance: Normal appearance.  HENT:     Head: Normocephalic and atraumatic.  Cardiovascular:     Rate and Rhythm: Normal rate and regular rhythm.     Heart sounds: Normal heart sounds.  Pulmonary:     Effort: Pulmonary effort is normal.     Breath  sounds: Normal breath sounds.  Musculoskeletal:        General: Tenderness present.     Comments: Right lower back tender to deep palpation. No overlying erythema.   Skin:    General: Skin is warm.  Neurological:     General: No focal deficit present.     Mental Status: She is alert.  Psychiatric:        Mood and Affect: Mood normal.        Behavior: Behavior normal.         Assessment And Plan:     1. Acute right-sided low back pain without sciatica  She was given toradol, 60mg  IM x 1. Topical pain cream was also applied to affeced area. She will call me in 24-48 hours to let me know how she is doing.   - ketorolac (TORADOL) injection 60 mg - POCT Urinalysis Dipstick (81002)  2. Muscle spasm  She is advised to start magnesium nightly. She is also advised to take cyclobenzaprine nightly as  needed.   [name], MD

## 2018-07-08 ENCOUNTER — Encounter: Payer: Self-pay | Admitting: Internal Medicine

## 2018-07-08 ENCOUNTER — Ambulatory Visit (INDEPENDENT_AMBULATORY_CARE_PROVIDER_SITE_OTHER): Payer: No Typology Code available for payment source | Admitting: Internal Medicine

## 2018-07-08 VITALS — BP 126/94 | HR 77 | Temp 98.1°F | Ht 64.0 in | Wt 184.8 lb

## 2018-07-08 DIAGNOSIS — E1165 Type 2 diabetes mellitus with hyperglycemia: Secondary | ICD-10-CM | POA: Diagnosis not present

## 2018-07-08 DIAGNOSIS — Z6831 Body mass index (BMI) 31.0-31.9, adult: Secondary | ICD-10-CM

## 2018-07-08 DIAGNOSIS — I10 Essential (primary) hypertension: Secondary | ICD-10-CM

## 2018-07-08 DIAGNOSIS — E6609 Other obesity due to excess calories: Secondary | ICD-10-CM

## 2018-07-08 NOTE — Progress Notes (Signed)
Subjective:     Patient ID: Sandra Sims , female    DOB: 05/21/1965 , 53 y.o.   MRN: [id number]   Chief Complaint  Patient presents with  . Diabetes  . Hypertension    HPI  Diabetes  She presents for her follow-up diabetic visit. She has type 2 diabetes mellitus. Her disease course has been stable. There are no hypoglycemic associated symptoms. Pertinent negatives for diabetes include no blurred vision and no chest pain. There are no hypoglycemic complications. Symptoms are stable. Risk factors for coronary artery disease include diabetes mellitus and hypertension.  Hypertension  This is a chronic problem. The current episode started more than 1 month ago. The problem is uncontrolled. Pertinent negatives include no blurred vision or chest pain.   Reports compliance with meds.   Past Medical History:  Diagnosis Date  . Fibroid      Family History  Problem Relation Age of Onset  . Colon polyps Mother   . Stroke Father   . Colon cancer Neg Hx   . Esophageal cancer Neg Hx   . Rectal cancer Neg Hx   . Stomach cancer Neg Hx   . Liver disease Neg Hx   . Pancreatic cancer Neg Hx   . Prostate cancer Neg Hx      Current Outpatient Medications:  .  BLACK CURRANT SEED OIL PO, Take by mouth. 1 tsp daily, Disp: , Rfl:  .  Chlorphen-PE-Acetaminophen (NOREL AD) 4-10-325 MG TABS, Take 1 tablet by mouth as needed., Disp: , Rfl:  .  Chlorphen-Phenyleph-ASA (ALKA-SELTZER PLUS COLD PO), Take by mouth., Disp: , Rfl:  .  cholecalciferol (VITAMIN D) 1000 units tablet, Take 1,000 Units by mouth daily., Disp: , Rfl:  .  cyclobenzaprine (FLEXERIL) 10 MG tablet, Take 10 mg by mouth at bedtime as needed., Disp: , Rfl: 0 .  hydrochlorothiazide (MICROZIDE) 12.5 MG capsule, Take 12.5 mg by mouth daily., Disp: , Rfl: 0 .  ibuprofen (ADVIL,MOTRIN) 800 MG tablet, Take 800 mg by mouth every 8 (eight) hours as needed., Disp: , Rfl:  .  magnesium oxide (MAG-OX) 400 MG tablet, Take 400 mg by mouth  daily., Disp: , Rfl:  .  mometasone (NASONEX) 50 MCG/ACT nasal spray, 2 sprays daily., Disp: , Rfl: 2 .  Omega-3 Fatty Acids (FISH OIL PO), Take 1,000 mg by mouth daily. , Disp: , Rfl:  .  OVER THE COUNTER MEDICATION, Apple cider vinegar 2 tsp daily, Disp: , Rfl:  .  vitamin C (ASCORBIC ACID) 500 MG tablet, Take 500 mg by mouth daily., Disp: , Rfl:    No Known Allergies   Review of Systems  Constitutional: Negative.   Eyes: Negative for blurred vision.  Respiratory: Negative.   Cardiovascular: Negative.  Negative for chest pain.  Gastrointestinal: Negative.   Neurological: Negative.   Psychiatric/Behavioral: Negative.      Today's Vitals   07/08/18 0934  BP: (!) 126/94  Pulse: 77  Temp: 98.1 F (36.7 C)  TempSrc: Oral  Weight: 184 lb 12.8 oz (83.8 kg)  Height: '5\' 4"'$  (1.626 m)   Body mass index is 31.72 kg/m.   Objective:  Physical Exam Vitals signs and nursing note reviewed.  Constitutional:      Appearance: Normal appearance.  HENT:     Head: Normocephalic and atraumatic.  Cardiovascular:     Rate and Rhythm: Normal rate and regular rhythm.     Heart sounds: Normal heart sounds.  Pulmonary:     Effort: Pulmonary effort  is normal.     Breath sounds: Normal breath sounds.  Skin:    General: Skin is warm.  Neurological:     General: No focal deficit present.     Mental Status: She is alert.  Psychiatric:        Mood and Affect: Mood normal.        Behavior: Behavior normal.         Assessment And Plan:     1. Uncontrolled type 2 diabetes mellitus with hyperglycemia ([place])  I will check labs as listed below. Importance of regular exercise was discussed with the patient. She does not like to take pharmaceutical meds, will discuss use of ACE/ARBs and statins at her next visit.   - CMP14+EGFR - Hemoglobin A1c  2. Essential hypertension, benign  Fair control. Optimal blood pressure is less than 130/80. She will continue with current meds for now. I plan to  discuss switching her to ACE/ARB at her next visit.   3. Class 1 obesity due to excess calories without serious comorbidity with body mass index (BMI) of 31.0 to 31.9 in adult  Importance of achieving optimal weight to decrease risk of cardiovascular disease and cancers was discussed with the patient in full detail. She is encouraged to start slowly - start with 10 minutes twice daily at least three to four days per week and to gradually build to 30 minutes five days weekly. She was given tips to incorporate more activity into her daily routine - take stairs when possible, park farther away from her job, grocery stores, etc.  [name], MD

## 2018-07-09 LAB — CMP14+EGFR
A/G RATIO: 1.4 (ref 1.2–2.2)
ALT: 15 IU/L (ref 0–32)
AST: 14 IU/L (ref 0–40)
Albumin: 4.1 g/dL (ref 3.8–4.9)
Alkaline Phosphatase: 74 IU/L (ref 39–117)
BUN/Creatinine Ratio: 14 (ref 9–23)
BUN: 10 mg/dL (ref 6–24)
Bilirubin Total: 0.5 mg/dL (ref 0.0–1.2)
CO2: 27 mmol/L (ref 20–29)
Calcium: 9.5 mg/dL (ref 8.7–10.2)
Chloride: 103 mmol/L (ref 96–106)
Creatinine, Ser: 0.7 mg/dL (ref 0.57–1.00)
GFR calc Af Amer: 115 mL/min/{1.73_m2} (ref >59)
GFR calc non Af Amer: 100 mL/min/{1.73_m2} (ref >59)
Globulin, Total: 3 g/dL (ref 1.5–4.5)
Glucose: 100 mg/dL — ABNORMAL HIGH (ref 65–99)
POTASSIUM: 4.2 mmol/L (ref 3.5–5.2)
Sodium: 143 mmol/L (ref 134–144)
Total Protein: 7.1 g/dL (ref 6.0–8.5)

## 2018-07-09 LAB — HEMOGLOBIN A1C
Est. average glucose Bld gHb Est-mCnc: 128 mg/dL
Hgb A1c MFr Bld: 6.1 % — ABNORMAL HIGH (ref 4.8–5.6)

## 2018-07-18 ENCOUNTER — Other Ambulatory Visit: Payer: Self-pay

## 2018-07-18 MED ORDER — HYDROCHLOROTHIAZIDE 12.5 MG PO CAPS: 12.5000 mg | ORAL_CAPSULE | Freq: Every day | ORAL | 2 refills | Status: DC

## 2018-07-18 MED FILL — HYDROCHLOROTHIAZIDE 12.5 MG: 12.5 | 90 days supply | Qty: 90 | Fill #0

## 2018-08-01 ENCOUNTER — Encounter: Payer: Self-pay | Admitting: Internal Medicine

## 2018-08-01 ENCOUNTER — Other Ambulatory Visit: Payer: Self-pay

## 2018-08-01 ENCOUNTER — Ambulatory Visit (INDEPENDENT_AMBULATORY_CARE_PROVIDER_SITE_OTHER): Payer: No Typology Code available for payment source | Admitting: Internal Medicine

## 2018-08-01 VITALS — BP 124/88 | HR 76 | Temp 98.5°F | Ht 64.0 in | Wt 185.0 lb

## 2018-08-01 DIAGNOSIS — N951 Menopausal and female climacteric states: Secondary | ICD-10-CM

## 2018-08-01 DIAGNOSIS — N912 Amenorrhea, unspecified: Secondary | ICD-10-CM

## 2018-08-01 DIAGNOSIS — Z6831 Body mass index (BMI) 31.0-31.9, adult: Secondary | ICD-10-CM | POA: Diagnosis not present

## 2018-08-01 NOTE — Progress Notes (Signed)
Subjective:     Patient ID: Sandra Sims , female    DOB: 01/18/1966 , 53 y.o.   MRN: [id number]   Chief Complaint  Patient presents with  . Hot flashes    HPI  She is here today to discuss her hot flashes. She reports they occur intermittently throughout the day. She has not been able to identify any triggers. She denies sleep deprivation. She admits she has not been exercising regularly. Last cycle was March 2019. She reports it has been more than 365 days. She denies mood changes.     Past Medical History:  Diagnosis Date  . Fibroid      Family History  Problem Relation Age of Onset  . Colon polyps Mother   . Stroke Father   . Colon cancer Neg Hx   . Esophageal cancer Neg Hx   . Rectal cancer Neg Hx   . Stomach cancer Neg Hx   . Liver disease Neg Hx   . Pancreatic cancer Neg Hx   . Prostate cancer Neg Hx      Current Outpatient Medications:  .  BLACK CURRANT SEED OIL PO, Take by mouth. 1 tsp daily, Disp: , Rfl:  .  BLACK ELDERBERRY,BERRY-FLOWER, PO, Take by mouth., Disp: , Rfl:  .  Chlorphen-PE-Acetaminophen (NOREL AD) 4-10-325 MG TABS, Take 1 tablet by mouth as needed., Disp: , Rfl:  .  Chlorphen-Phenyleph-ASA (ALKA-SELTZER PLUS COLD PO), Take by mouth., Disp: , Rfl:  .  cholecalciferol (VITAMIN D) 1000 units tablet, Take 1,000 Units by mouth daily., Disp: , Rfl:  .  cyclobenzaprine (FLEXERIL) 10 MG tablet, Take 10 mg by mouth at bedtime as needed., Disp: , Rfl: 0 .  Dextromethorphan-guaiFENesin (CORICIDIN HBP CONGESTION/COUGH PO), Take by mouth., Disp: , Rfl:  .  hydrochlorothiazide (MICROZIDE) 12.5 MG capsule, Take 1 capsule (12.5 mg total) by mouth daily., Disp: 90 capsule, Rfl: 2 .  ibuprofen (ADVIL,MOTRIN) 800 MG tablet, Take 800 mg by mouth every 8 (eight) hours as needed., Disp: , Rfl:  .  magnesium oxide (MAG-OX) 400 MG tablet, Take 400 mg by mouth daily., Disp: , Rfl:  .  mometasone (NASONEX) 50 MCG/ACT nasal spray, 2 sprays daily., Disp: , Rfl: 2 .   Multiple Vitamins-Minerals (ZINC PO), Take by mouth., Disp: , Rfl:  .  Omega-3 Fatty Acids (FISH OIL PO), Take 1,000 mg by mouth daily. , Disp: , Rfl:  .  OVER THE COUNTER MEDICATION, Apple cider vinegar 2 tsp daily, Disp: , Rfl:  .  vitamin C (ASCORBIC ACID) 500 MG tablet, Take 500 mg by mouth daily., Disp: , Rfl:    No Known Allergies   Review of Systems  Constitutional: Negative.   Respiratory: Negative.   Cardiovascular: Negative.   Gastrointestinal: Negative.   Genitourinary: Positive for menstrual problem (she has been having hot flashes/night sweats).  Neurological: Negative.   Psychiatric/Behavioral: Negative.      Today's Vitals   08/01/18 0932  BP: 124/88  Pulse: 76  Temp: 98.5 F (36.9 C)  TempSrc: Oral  Weight: 185 lb (83.9 kg)  Height: 5\' 4"  (1.626 m)   Body mass index is 31.76 kg/m.   Objective:  Physical Exam Vitals signs and nursing note reviewed.  Constitutional:      Appearance: Normal appearance.  HENT:     Head: Normocephalic and atraumatic.  Cardiovascular:     Rate and Rhythm: Normal rate and regular rhythm.     Heart sounds: Normal heart sounds.  Pulmonary:  Effort: Pulmonary effort is normal.     Breath sounds: Normal breath sounds.  Skin:    General: Skin is warm.  Neurological:     General: No focal deficit present.     Mental Status: She is alert.  Psychiatric:        Mood and Affect: Mood normal.        Behavior: Behavior normal.         Assessment And Plan:     1. Female climacteric state  She is encouraged to increase her intake of cruciferous vegetables and to avoid sugary beverages. If her symptoms persist, she will consider other natural therapies.   2. Absence of menstruation  She has not had a cycle in over 1 year. She is interested in bloodwork to confirm she is in menopause.   - [place]  3. BMI 31.0-31.9,adult  Her weight is stable. She is encouraged to resume her regular exercise regimen.   [name], MD

## 2018-08-01 NOTE — Patient Instructions (Signed)
Menopause  Menopause is the normal time of life when menstrual periods stop completely. It is usually confirmed by 12 months without a menstrual period. The transition to menopause (perimenopause) most often happens between the ages of 45 and 55. During perimenopause, hormone levels change in your body, which can cause symptoms and affect your health. Menopause may increase your risk for:   Loss of bone (osteoporosis), which causes bone breaks (fractures).   Depression.   Hardening and narrowing of the arteries (atherosclerosis), which can cause heart attacks and strokes.  What are the causes?  This condition is usually caused by a natural change in hormone levels that happens as you get older. The condition may also be caused by surgery to remove both ovaries (bilateral oophorectomy).  What increases the risk?  This condition is more likely to start at an earlier age if you have certain medical conditions or treatments, including:   A tumor of the pituitary gland in the brain.   A disease that affects the ovaries and hormone production.   Radiation treatment for cancer.   Certain cancer treatments, such as chemotherapy or hormone (anti-estrogen) therapy.   Heavy smoking and excessive alcohol use.   Family history of early menopause.  This condition is also more likely to develop earlier in women who are very thin.  What are the signs or symptoms?  Symptoms of this condition include:   Hot flashes.   Irregular menstrual periods.   Night sweats.   Changes in feelings about sex. This could be a decrease in sex drive or an increased comfort around your sexuality.   Vaginal dryness and thinning of the vaginal walls. This may cause painful intercourse.   Dryness of the skin and development of wrinkles.   Headaches.   Problems sleeping (insomnia).   Mood swings or irritability.   Memory problems.   Weight gain.   Hair growth on the face and chest.   Bladder infections or problems with urinating.  How  is this diagnosed?  This condition is diagnosed based on your medical history, a physical exam, your age, your menstrual history, and your symptoms. Hormone tests may also be done.  How is this treated?  In some cases, no treatment is needed. You and your health care provider should make a decision together about whether treatment is necessary. Treatment will be based on your individual condition and preferences. Treatment for this condition focuses on managing symptoms. Treatment may include:   Menopausal hormone therapy (MHT).   Medicines to treat specific symptoms or complications.   Acupuncture.   Vitamin or herbal supplements.  Before starting treatment, make sure to let your health care provider know if you have a personal or family history of:   Heart disease.   Breast cancer.   Blood clots.   Diabetes.   Osteoporosis.  Follow these instructions at home:  Lifestyle   Do not use any products that contain nicotine or tobacco, such as cigarettes and e-cigarettes. If you need help quitting, ask your health care provider.   Get at least 30 minutes of physical activity on 5 or more days each week.   Avoid alcoholic and caffeinated beverages, as well as spicy foods. This may help prevent hot flashes.   Get 7-8 hours of sleep each night.   If you have hot flashes, try:  ? Dressing in layers.  ? Avoiding things that may trigger hot flashes, such as spicy food, warm places, or stress.  ? Taking slow, deep   breaths when a hot flash starts.  ? Keeping a fan in your home and office.   Find ways to manage stress, such as deep breathing, meditation, or journaling.   Consider going to group therapy with other women who are having menopause symptoms. Ask your health care provider about recommended group therapy meetings.  Eating and drinking   Eat a healthy, balanced diet that contains whole grains, lean protein, low-fat dairy, and plenty of fruits and vegetables.   Your health care provider may recommend  adding more soy to your diet. Foods that contain soy include tofu, tempeh, and soy milk.   Eat plenty of foods that contain calcium and vitamin D for bone health. Items that are rich in calcium include low-fat milk, yogurt, beans, almonds, sardines, broccoli, and kale.  Medicines   Take over-the-counter and prescription medicines only as told by your health care provider.   Talk with your health care provider before starting any herbal supplements. If prescribed, take vitamins and supplements as told by your health care provider. These may include:  ? Calcium. Women age 51 and older should get 1,200 mg (milligrams) of calcium every day.  ? Vitamin D. Women need 600-800 International Units of vitamin D each day.  ? Vitamins B12 and B6. Aim for 50 micrograms of B12 and 1.5 mg of B6 each day.  General instructions   Keep track of your menstrual periods, including:  ? When they occur.  ? How heavy they are and how long they last.  ? How much time passes between periods.   Keep track of your symptoms, noting when they start, how often you have them, and how long they last.   Use vaginal lubricants or moisturizers to help with vaginal dryness and improve comfort during sex.   Keep all follow-up visits as told by your health care provider. This is important. This includes any group therapy or counseling.  Contact a health care provider if:   You are still having menstrual periods after age 55.   You have pain during sex.   You have not had a period for 12 months and you develop vaginal bleeding.  Get help right away if:   You have:  ? Severe depression.  ? Excessive vaginal bleeding.  ? Pain when you urinate.  ? A fast or irregular heart beat (palpitations).  ? Severe headaches.  ? Abdomen (abdominal) pain or severe indigestion.   You fell and you think you have a broken bone.   You develop leg or chest pain.   You develop vision problems.   You feel a lump in your breast.  Summary   Menopause is the normal  time of life when menstrual periods stop completely. It is usually confirmed by 12 months without a menstrual period.   The transition to menopause (perimenopause) most often happens between the ages of 45 and 55.   Symptoms can be managed through medicines, lifestyle changes, and complementary therapies such as acupuncture.   Eat a balanced diet that is rich in nutrients to promote bone health and heart health and to manage symptoms during menopause.  This information is not intended to replace advice given to you by your health care provider. Make sure you discuss any questions you have with your health care provider.  Document Released: 07/15/2003 Document Revised: 05/27/2016 Document Reviewed: 05/27/2016  Elsevier Interactive Patient Education  2019 [employer]

## 2018-08-02 LAB — FOLLICLE STIMULATING HORMONE: FSH: 84.9 m[IU]/mL

## 2018-10-03 ENCOUNTER — Other Ambulatory Visit: Payer: Self-pay

## 2018-10-03 MED ORDER — SEMAGLUTIDE(0.25 OR 0.5MG/DOS) 2 MG/1.5ML ~~LOC~~ SOPN: 0.2500 mL | PEN_INJECTOR | SUBCUTANEOUS | 3 refills | Status: DC

## 2018-10-08 ENCOUNTER — Other Ambulatory Visit: Payer: Self-pay

## 2018-10-08 ENCOUNTER — Ambulatory Visit (INDEPENDENT_AMBULATORY_CARE_PROVIDER_SITE_OTHER): Payer: No Typology Code available for payment source | Admitting: Internal Medicine

## 2018-10-08 ENCOUNTER — Encounter: Payer: Self-pay | Admitting: Internal Medicine

## 2018-10-08 VITALS — BP 138/86 | HR 67 | Temp 98.1°F | Ht 64.0 in | Wt 189.6 lb

## 2018-10-08 DIAGNOSIS — E1165 Type 2 diabetes mellitus with hyperglycemia: Secondary | ICD-10-CM | POA: Diagnosis not present

## 2018-10-08 DIAGNOSIS — Z0000 Encounter for general adult medical examination without abnormal findings: Secondary | ICD-10-CM | POA: Diagnosis not present

## 2018-10-08 DIAGNOSIS — I10 Essential (primary) hypertension: Secondary | ICD-10-CM

## 2018-10-08 DIAGNOSIS — E6609 Other obesity due to excess calories: Secondary | ICD-10-CM | POA: Diagnosis not present

## 2018-10-08 DIAGNOSIS — M25512 Pain in left shoulder: Secondary | ICD-10-CM

## 2018-10-08 DIAGNOSIS — Z6832 Body mass index (BMI) 32.0-32.9, adult: Secondary | ICD-10-CM

## 2018-10-08 LAB — POCT URINALYSIS DIPSTICK
Bilirubin, UA: NEGATIVE
Glucose, UA: NEGATIVE
Ketones, UA: NEGATIVE
Leukocytes, UA: NEGATIVE
Nitrite, UA: NEGATIVE
Protein, UA: NEGATIVE
Spec Grav, UA: 1.02 (ref 1.010–1.025)
Urobilinogen, UA: 0.2 E.U./dL
pH, UA: 5.5 (ref 5.0–8.0)

## 2018-10-08 LAB — POCT UA - MICROALBUMIN
Albumin/Creatinine Ratio, Urine, POC: 30
Creatinine, POC: 200 mg/dL
Microalbumin Ur, POC: 10 mg/L

## 2018-10-08 NOTE — Progress Notes (Signed)
Subjective:     Patient ID: Sandra Sims , female    DOB: [birth year] , 53 y.o.   MRN: [id number]   Chief Complaint  Patient presents with  . Annual Exam  . Diabetes  . Hypertension    HPI  She is here today for a full physical examination. She is followed by GYN for her pelvic exams. She is up to date with her pap smears.   Diabetes  She presents for her follow-up diabetic visit. She has type 2 diabetes mellitus. Her disease course has been stable. There are no hypoglycemic associated symptoms. Pertinent negatives for diabetes include no blurred vision and no chest pain. There are no hypoglycemic complications. Risk factors for coronary artery disease include diabetes mellitus, dyslipidemia, hypertension, obesity and post-menopausal. She is compliant with treatment most of the time. She is following a diabetic diet. She participates in exercise three times a week. An ACE inhibitor/angiotensin II receptor blocker is not being taken.  Hypertension  This is a chronic problem. The current episode started more than 1 year ago. The problem has been gradually improving since onset. The problem is controlled. Pertinent negatives include no blurred vision, chest pain, palpitations or shortness of breath. The current treatment provides moderate improvement. There are no compliance problems.      Past Medical History:  Diagnosis Date  . Fibroid      Family History  Problem Relation Age of Onset  . Colon polyps Mother   . Stroke Father   . Colon cancer Neg Hx   . Esophageal cancer Neg Hx   . Rectal cancer Neg Hx   . Stomach cancer Neg Hx   . Liver disease Neg Hx   . Pancreatic cancer Neg Hx   . Prostate cancer Neg Hx      Current Outpatient Medications:  .  BLACK CURRANT SEED OIL PO, Take by mouth. 1 tsp daily, Disp: , Rfl:  .  BLACK ELDERBERRY,BERRY-FLOWER, PO, Take by mouth., Disp: , Rfl:  .  Chlorphen-PE-Acetaminophen (NOREL AD) 4-10-325 MG TABS, Take 1 tablet by mouth as  needed., Disp: , Rfl:  .  Chlorphen-Phenyleph-ASA (ALKA-SELTZER PLUS COLD PO), Take by mouth., Disp: , Rfl:  .  cholecalciferol (VITAMIN D) 1000 units tablet, Take 1,000 Units by mouth daily., Disp: , Rfl:  .  cyclobenzaprine (FLEXERIL) 10 MG tablet, Take 10 mg by mouth at bedtime as needed., Disp: , Rfl: 0 .  Dextromethorphan-guaiFENesin (CORICIDIN HBP CONGESTION/COUGH PO), Take by mouth., Disp: , Rfl:  .  hydrochlorothiazide (MICROZIDE) 12.5 MG capsule, Take 1 capsule (12.5 mg total) by mouth daily., Disp: 90 capsule, Rfl: 2 .  ibuprofen (ADVIL,MOTRIN) 800 MG tablet, Take 800 mg by mouth every 8 (eight) hours as needed., Disp: , Rfl:  .  magnesium oxide (MAG-OX) 400 MG tablet, Take 400 mg by mouth daily., Disp: , Rfl:  .  mometasone (NASONEX) 50 MCG/ACT nasal spray, 2 sprays daily., Disp: , Rfl: 2 .  Multiple Vitamins-Minerals (ZINC PO), Take by mouth., Disp: , Rfl:  .  Omega-3 Fatty Acids (FISH OIL PO), Take 1,000 mg by mouth daily. , Disp: , Rfl:  .  OVER THE COUNTER MEDICATION, Apple cider vinegar 2 tsp daily, Disp: , Rfl:  .  Semaglutide,0.25 or 0.'5MG'$ /DOS, (OZEMPIC, 0.25 OR 0.5 MG/DOSE,) 2 MG/1.5ML SOPN, Inject 0.25 mLs into the skin once a week., Disp: 1.5 mL, Rfl: 3 .  vitamin C (ASCORBIC ACID) 500 MG tablet, Take 500 mg by mouth daily., Disp: , Rfl:  No Known Allergies    The patient states she uses post menopausal status for birth control. Last LMP was No LMP recorded (lmp unknown). Patient is postmenopausal.. Negative for Dysmenorrhea Negative for: breast discharge, breast lump(s), breast pain and breast self exam. Associated symptoms include abnormal vaginal bleeding. Pertinent negatives include abnormal bleeding (hematology), anxiety, decreased libido, depression, difficulty falling sleep, dyspareunia, history of infertility, nocturia, sexual dysfunction, sleep disturbances, urinary incontinence, urinary urgency, vaginal discharge and vaginal itching. Diet regular.The patient states  her exercise level is  intermittent.   . The patient's tobacco use is:  Social History   Tobacco Use  Smoking Status Former Smoker  . Packs/day: 0.50  . Years: 11.00  . Pack years: 5.50  . Last attempt to quit: 02/06/1995  . Years since quitting: 23.6  Smokeless Tobacco Never Used  Tobacco Comment   quit smoking 22 years ago  . She has been exposed to passive smoke. The patient's alcohol use is:  Social History   Substance and Sexual Activity  Alcohol Use No  . Frequency: Never   Review of Systems  Constitutional: Negative.   HENT: Negative.   Eyes: Negative.  Negative for blurred vision.  Respiratory: Negative.  Negative for shortness of breath.   Cardiovascular: Negative.  Negative for chest pain and palpitations.  Gastrointestinal: Negative.   Endocrine: Negative.   Genitourinary: Negative.   Musculoskeletal: Positive for arthralgias.       She c/o left shoulder pain. There is pain with movement. She denies trauma/fall. She thinks she may have pulled something when lifting her 31-monthold grandson. She is unable to lift her arm higher than shoulder level.   Allergic/Immunologic: Negative.   Neurological: Negative.   Hematological: Negative.   Psychiatric/Behavioral: Negative.      Today's Vitals   10/08/18 1023  BP: 138/86  Pulse: 67  Temp: 98.1 F (36.7 C)  TempSrc: Oral  Weight: 189 lb 9.6 oz (86 kg)  Height: '5\' 4"'$  (1.626 m)   Body mass index is 32.54 kg/m.   Objective:  Physical Exam Vitals signs and nursing note reviewed.  Constitutional:      Appearance: Normal appearance.  HENT:     Head: Normocephalic and atraumatic.     Right Ear: Tympanic membrane, ear canal and external ear normal.     Left Ear: Tympanic membrane, ear canal and external ear normal.     Nose: Nose normal.     Mouth/Throat:     Mouth: Mucous membranes are moist.     Pharynx: Oropharynx is clear.  Eyes:     Extraocular Movements: Extraocular movements intact.      Conjunctiva/sclera: Conjunctivae normal.     Pupils: Pupils are equal, round, and reactive to light.  Neck:     Musculoskeletal: Normal range of motion and neck supple.  Cardiovascular:     Rate and Rhythm: Normal rate and regular rhythm.     Pulses: Normal pulses.          Dorsalis pedis pulses are 2+ on the right side and 2+ on the left side.       Posterior tibial pulses are 2+ on the right side and 2+ on the left side.     Heart sounds: Normal heart sounds.  Pulmonary:     Effort: Pulmonary effort is normal.     Breath sounds: Normal breath sounds.  Chest:     Breasts:        Right: Normal. No swelling, bleeding, inverted nipple, mass  or nipple discharge.        Left: Normal. No swelling, bleeding, inverted nipple, mass or nipple discharge.  Abdominal:     General: Abdomen is flat. Bowel sounds are normal.     Palpations: Abdomen is soft.  Genitourinary:    Comments: deferred Musculoskeletal: Normal range of motion.     Left shoulder: She exhibits tenderness.  Feet:     Right foot:     Protective Sensation: 5 sites tested. 5 sites sensed.     Skin integrity: Skin integrity normal.     Toenail Condition: Right toenails are normal.     Left foot:     Protective Sensation: 5 sites tested. 5 sites sensed.     Skin integrity: Skin integrity normal.     Toenail Condition: Left toenails are normal.  Skin:    General: Skin is warm and dry.  Neurological:     General: No focal deficit present.     Mental Status: She is alert and oriented to person, place, and time.  Psychiatric:        Mood and Affect: Mood normal.        Behavior: Behavior normal.         Assessment And Plan:     1. Routine general medical examination at health care facility  A full exam was performed. Importance of monthly self breast exams was discussed with the patient. PATIENT HAS BEEN ADVISED TO GET 30-45 MINUTES REGULAR EXERCISE NO LESS THAN FOUR TO FIVE DAYS PER WEEK - BOTH WEIGHTBEARING EXERCISES  AND AEROBIC ARE RECOMMENDED.  SHE IS ADVISED TO FOLLOW A HEALTHY DIET WITH AT LEAST SIX FRUITS/VEGGIES PER DAY, DECREASE INTAKE OF RED MEAT, AND TO INCREASE FISH INTAKE TO TWO DAYS PER WEEK.  MEATS/FISH SHOULD NOT BE FRIED, BAKED OR BROILED IS PREFERABLE.  I SUGGEST WEARING SPF 50 SUNSCREEN ON EXPOSED PARTS AND ESPECIALLY WHEN IN THE DIRECT SUNLIGHT FOR AN EXTENDED PERIOD OF TIME.  PLEASE AVOID FAST FOOD RESTAURANTS AND INCREASE YOUR WATER INTAKE.  - CMP14+EGFR - CBC - Lipid panel - Hemoglobin A1c - TSH  2. Uncontrolled type 2 diabetes mellitus with hyperglycemia (HCC)  Diabetic foot exam was performed. I DISCUSSED WITH THE PATIENT AT LENGTH REGARDING THE GOALS OF GLYCEMIC CONTROL AND POSSIBLE LONG-TERM COMPLICATIONS.  I  ALSO STRESSED THE IMPORTANCE OF COMPLIANCE WITH HOME GLUCOSE MONITORING, DIETARY RESTRICTIONS INCLUDING AVOIDANCE OF SUGARY DRINKS/PROCESSED FOODS,  ALONG WITH REGULAR EXERCISE.  I  ALSO STRESSED THE IMPORTANCE OF ANNUAL EYE EXAMS, SELF FOOT CARE AND COMPLIANCE WITH OFFICE VISITS.  - POCT Urinalysis Dipstick (81002) - POCT UA - Microalbumin  3. Essential hypertension, benign  Fair control. She will continue with current meds for now. I will discuss use of ARBs at her next visit. She is encouraged to avoid adding salt to her foods. EKG performed, no new changes noted. She will rto in six months for re-evaluation.   - EKG 12-Lead  4. Acute pain of left shoulder  I will refer her to Ortho as requested. She will check to confirm Dr. Rip Harbour is a [facility] participating provider.   5. Class 1 obesity due to excess calories with serious comorbidity and body mass index (BMI) of 32.0 to 32.9 in adult  Importance of achieving optimal weight to decrease risk of cardiovascular disease and cancers was discussed with the patient in full detail. She is encouraged to start slowly - start with 10 minutes twice daily at least three to four days per week and  to gradually build to 30 minutes  five days weekly. She was given tips to incorporate more activity into her daily routine - take stairs when possible, park farther away from her job, grocery stores, etc.    [name], MD    THE PATIENT IS ENCOURAGED TO PRACTICE SOCIAL DISTANCING DUE TO THE COVID-19 PANDEMIC.

## 2018-10-08 NOTE — Patient Instructions (Signed)

## 2018-10-09 LAB — TSH: TSH: 2.36 u[IU]/mL (ref 0.450–4.500)

## 2018-10-09 LAB — CBC
Hematocrit: 39.5 % (ref 34.0–46.6)
Hemoglobin: 12.8 g/dL (ref 11.1–15.9)
MCH: 25.3 pg — ABNORMAL LOW (ref 26.6–33.0)
MCHC: 32.4 g/dL (ref 31.5–35.7)
MCV: 78 fL — ABNORMAL LOW (ref 79–97)
Platelets: 312 10*3/uL (ref 150–450)
RBC: 5.05 x10E6/uL (ref 3.77–5.28)
RDW: 14.9 % (ref 11.7–15.4)
WBC: 6 10*3/uL (ref 3.4–10.8)

## 2018-10-09 LAB — LIPID PANEL
Chol/HDL Ratio: 3.7 ratio (ref 0.0–4.4)
Cholesterol, Total: 226 mg/dL — ABNORMAL HIGH (ref 100–199)
HDL: 61 mg/dL (ref >39)
LDL Calculated: 145 mg/dL — ABNORMAL HIGH (ref 0–99)
Triglycerides: 102 mg/dL (ref 0–149)
VLDL Cholesterol Cal: 20 mg/dL (ref 5–40)

## 2018-10-09 LAB — CMP14+EGFR
ALT: 18 IU/L (ref 0–32)
AST: 17 IU/L (ref 0–40)
Albumin/Globulin Ratio: 1.3 (ref 1.2–2.2)
Albumin: 4.3 g/dL (ref 3.8–4.9)
Alkaline Phosphatase: 84 IU/L (ref 39–117)
BUN/Creatinine Ratio: 12 (ref 9–23)
BUN: 9 mg/dL (ref 6–24)
Bilirubin Total: 0.7 mg/dL (ref 0.0–1.2)
CO2: 27 mmol/L (ref 20–29)
Calcium: 10 mg/dL (ref 8.7–10.2)
Chloride: 96 mmol/L (ref 96–106)
Creatinine, Ser: 0.73 mg/dL (ref 0.57–1.00)
GFR calc Af Amer: 110 mL/min/{1.73_m2} (ref >59)
GFR calc non Af Amer: 95 mL/min/{1.73_m2} (ref >59)
Globulin, Total: 3.2 g/dL (ref 1.5–4.5)
Glucose: 83 mg/dL (ref 65–99)
Potassium: 4.3 mmol/L (ref 3.5–5.2)
Sodium: 140 mmol/L (ref 134–144)
Total Protein: 7.5 g/dL (ref 6.0–8.5)

## 2018-10-09 LAB — HEMOGLOBIN A1C
Est. average glucose Bld gHb Est-mCnc: 137 mg/dL
Hgb A1c MFr Bld: 6.4 % — ABNORMAL HIGH (ref 4.8–5.6)

## 2018-10-10 ENCOUNTER — Encounter: Payer: Self-pay | Admitting: Internal Medicine

## 2018-10-15 MED FILL — IBUPROFEN 800 MG TABS: 800 | 20 days supply | Qty: 60 | Fill #0

## 2018-10-21 ENCOUNTER — Encounter: Payer: Self-pay | Admitting: Internal Medicine

## 2018-10-23 ENCOUNTER — Telehealth: Payer: Self-pay

## 2018-10-23 ENCOUNTER — Other Ambulatory Visit: Payer: Self-pay

## 2018-10-23 MED ORDER — OZEMPIC (0.25 OR 0.5 MG/DOSE) 2 MG/1.5ML ~~LOC~~ SOPN: 0.2500 mL | PEN_INJECTOR | SUBCUTANEOUS | 3 refills | Status: DC

## 2018-10-23 NOTE — Telephone Encounter (Signed)
Called pharm to see status of ozempic

## 2018-10-24 ENCOUNTER — Telehealth: Payer: Self-pay

## 2018-10-25 NOTE — Telephone Encounter (Signed)
PA sent to plan for ozempic

## 2018-10-28 ENCOUNTER — Other Ambulatory Visit: Payer: Self-pay

## 2018-10-28 MED ORDER — OZEMPIC (0.25 OR 0.5 MG/DOSE) 2 MG/1.5ML ~~LOC~~ SOPN: 0.2500 mL | PEN_INJECTOR | SUBCUTANEOUS | 1 refills | Status: DC

## 2018-10-30 ENCOUNTER — Other Ambulatory Visit: Payer: Self-pay

## 2018-10-30 MED ORDER — OZEMPIC (0.25 OR 0.5 MG/DOSE) 2 MG/1.5ML ~~LOC~~ SOPN: 0.2500 mg | PEN_INJECTOR | SUBCUTANEOUS | 1 refills | Status: DC

## 2018-10-30 MED FILL — OZEMPIC 0.25 OR 0.5 MG/DOSE: 2 | 56 days supply | Qty: 2 | Fill #0

## 2018-11-26 MED FILL — HYDROCHLOROTHIAZIDE 12.5 MG: 12.5 | 90 days supply | Qty: 90 | Fill #1

## 2018-11-27 ENCOUNTER — Other Ambulatory Visit: Payer: Self-pay

## 2018-11-27 MED ORDER — OZEMPIC (0.25 OR 0.5 MG/DOSE) 2 MG/1.5ML ~~LOC~~ SOPN: 0.5000 mg | PEN_INJECTOR | SUBCUTANEOUS | 1 refills | Status: DC

## 2018-11-27 MED FILL — OZEMPIC 0.25 OR 0.5 MG/DOSE: 2 | 28 days supply | Qty: 2 | Fill #0

## 2018-11-29 ENCOUNTER — Other Ambulatory Visit: Payer: Self-pay | Admitting: Obstetrics and Gynecology

## 2018-12-06 ENCOUNTER — Other Ambulatory Visit (HOSPITAL_COMMUNITY)
Admission: RE | Admit: 2018-12-06 | Discharge: 2018-12-06 | Disposition: A | Discharge status: Home / Self Care | Payer: No Typology Code available for payment source | Source: Ambulatory Visit | Attending: Obstetrics and Gynecology | Admitting: Obstetrics and Gynecology

## 2018-12-06 ENCOUNTER — Encounter (HOSPITAL_COMMUNITY)
Admission: RE | Admit: 2018-12-06 | Discharge: 2018-12-06 | Disposition: A | Discharge status: Home / Self Care | Payer: No Typology Code available for payment source | Source: Ambulatory Visit | Attending: Obstetrics and Gynecology | Admitting: Obstetrics and Gynecology

## 2018-12-06 ENCOUNTER — Encounter (HOSPITAL_BASED_OUTPATIENT_CLINIC_OR_DEPARTMENT_OTHER): Payer: Self-pay | Admitting: *Deleted

## 2018-12-06 ENCOUNTER — Other Ambulatory Visit: Payer: Self-pay

## 2018-12-06 DIAGNOSIS — Z01812 Encounter for preprocedural laboratory examination: Secondary | ICD-10-CM | POA: Diagnosis present

## 2018-12-06 DIAGNOSIS — Z20828 Contact with and (suspected) exposure to other viral communicable diseases: Secondary | ICD-10-CM | POA: Insufficient documentation

## 2018-12-06 LAB — BASIC METABOLIC PANEL
Anion gap: 9 (ref 5–15)
BUN: 13 mg/dL (ref 6–20)
CO2: 31 mmol/L (ref 22–32)
Calcium: 9.8 mg/dL (ref 8.9–10.3)
Chloride: 99 mmol/L (ref 98–111)
Creatinine, Ser: 0.73 mg/dL (ref 0.44–1.00)
GFR calc Af Amer: >60 mL/min (ref >60)
GFR calc non Af Amer: >60 mL/min (ref >60)
Glucose, Bld: 86 mg/dL (ref 70–99)
Potassium: 3.5 mmol/L (ref 3.5–5.1)
Sodium: 139 mmol/L (ref 135–145)

## 2018-12-06 LAB — CBC
HCT: 40.5 % (ref 36.0–46.0)
Hemoglobin: 12.9 g/dL (ref 12.0–15.0)
MCH: 25.3 pg — ABNORMAL LOW (ref 26.0–34.0)
MCHC: 31.9 g/dL (ref 30.0–36.0)
MCV: 79.4 fL — ABNORMAL LOW (ref 80.0–100.0)
Platelets: 401 10*3/uL — ABNORMAL HIGH (ref 150–400)
RBC: 5.1 MIL/uL (ref 3.87–5.11)
RDW: 14.7 % (ref 11.5–15.5)
WBC: 5.1 10*3/uL (ref 4.0–10.5)
nRBC: 0 % (ref 0.0–0.2)

## 2018-12-06 LAB — SARS CORONAVIRUS 2 (TAT 6-24 HRS): SARS Coronavirus 2: NEGATIVE

## 2018-12-06 NOTE — Progress Notes (Signed)
Spoke w/ pt via phone for pre-op interview.  Npo after mn.  Arrive at 0530.  Current lab results dated 12-06-2018 and ekg in chart and epic.  Pt had covid test done today.

## 2018-12-09 NOTE — Anesthesia Preprocedure Evaluation (Addendum)
Anesthesia Evaluation  Patient identified by MRN, date of birth, ID band Patient awake    Reviewed: Allergy & Precautions, NPO status , Patient's Chart, lab work & pertinent test results  Airway Mallampati: II  TM Distance: >3 FB Neck ROM: Full    Dental  (+) Dental Advisory Given   Pulmonary former smoker,    breath sounds clear to auscultation       Cardiovascular hypertension, Pt. on medications  Rhythm:Regular Rate:Normal     Neuro/Psych negative neurological ROS     GI/Hepatic negative GI ROS, Neg liver ROS,   Endo/Other  diabetes, Type 2  Renal/GU negative Renal ROS     Musculoskeletal   Abdominal   Peds  Hematology  (+) Sickle cell trait ,   Anesthesia Other Findings   Reproductive/Obstetrics                            Lab Results  Component Value Date   WBC 5.1 12/06/2018   HGB 12.9 12/06/2018   HCT 40.5 12/06/2018   MCV 79.4 (L) 12/06/2018   PLT 401 (H) 12/06/2018   Lab Results  Component Value Date   CREATININE 0.73 12/06/2018   BUN 13 12/06/2018   NA 139 12/06/2018   K 3.5 12/06/2018   CL 99 12/06/2018   CO2 31 12/06/2018   COVID-19 Labs  No results for input(s): DDIMER, FERRITIN, LDH, CRP in the last 72 hours.  Lab Results  Component Value Date   Edna NEGATIVE 12/06/2018    Anesthesia Physical Anesthesia Plan  ASA: II  Anesthesia Plan: General   Post-op Pain Management:    Induction: Intravenous  PONV Risk Score and Plan: 3 and Midazolam, Dexamethasone, Ondansetron and Treatment may vary due to age or medical condition  Airway Management Planned: LMA  Additional Equipment:   Intra-op Plan:   Post-operative Plan: Extubation in OR  Informed Consent: I have reviewed the patients History and Physical, chart, labs and discussed the procedure including the risks, benefits and alternatives for the proposed anesthesia with the patient or  authorized representative who has indicated his/her understanding and acceptance.     Dental advisory given  Plan Discussed with: CRNA  Anesthesia Plan Comments:        Anesthesia Quick Evaluation

## 2018-12-10 ENCOUNTER — Encounter (HOSPITAL_BASED_OUTPATIENT_CLINIC_OR_DEPARTMENT_OTHER): Payer: Self-pay

## 2018-12-10 ENCOUNTER — Ambulatory Visit (HOSPITAL_BASED_OUTPATIENT_CLINIC_OR_DEPARTMENT_OTHER): Payer: No Typology Code available for payment source | Admitting: Anesthesiology

## 2018-12-10 ENCOUNTER — Other Ambulatory Visit: Payer: Self-pay

## 2018-12-10 ENCOUNTER — Ambulatory Visit (HOSPITAL_BASED_OUTPATIENT_CLINIC_OR_DEPARTMENT_OTHER): Payer: No Typology Code available for payment source | Admitting: Physician Assistant

## 2018-12-10 ENCOUNTER — Ambulatory Visit (HOSPITAL_BASED_OUTPATIENT_CLINIC_OR_DEPARTMENT_OTHER)
Admission: RE | Admit: 2018-12-10 | Discharge: 2018-12-10 | Disposition: A | Discharge status: Home / Self Care | Payer: No Typology Code available for payment source | Attending: Obstetrics and Gynecology | Admitting: Obstetrics and Gynecology

## 2018-12-10 ENCOUNTER — Encounter (HOSPITAL_BASED_OUTPATIENT_CLINIC_OR_DEPARTMENT_OTHER)
Admission: RE | Disposition: A | Discharge status: Home / Self Care | Payer: Self-pay | Source: Home / Self Care | Attending: Obstetrics and Gynecology

## 2018-12-10 DIAGNOSIS — Z87891 Personal history of nicotine dependence: Secondary | ICD-10-CM | POA: Diagnosis not present

## 2018-12-10 DIAGNOSIS — I10 Essential (primary) hypertension: Secondary | ICD-10-CM | POA: Insufficient documentation

## 2018-12-10 DIAGNOSIS — N950 Postmenopausal bleeding: Secondary | ICD-10-CM | POA: Insufficient documentation

## 2018-12-10 DIAGNOSIS — D250 Submucous leiomyoma of uterus: Secondary | ICD-10-CM | POA: Diagnosis not present

## 2018-12-10 DIAGNOSIS — R9389 Abnormal findings on diagnostic imaging of other specified body structures: Secondary | ICD-10-CM | POA: Insufficient documentation

## 2018-12-10 DIAGNOSIS — D573 Sickle-cell trait: Secondary | ICD-10-CM | POA: Diagnosis not present

## 2018-12-10 DIAGNOSIS — E119 Type 2 diabetes mellitus without complications: Secondary | ICD-10-CM | POA: Insufficient documentation

## 2018-12-10 HISTORY — DX: Leiomyoma of uterus, unspecified: D25.9

## 2018-12-10 HISTORY — DX: Abnormal findings on diagnostic imaging of other specified body structures: R93.89

## 2018-12-10 HISTORY — DX: Postmenopausal bleeding: N95.0

## 2018-12-10 HISTORY — DX: Sickle-cell trait: D57.3

## 2018-12-10 HISTORY — DX: Presence of spectacles and contact lenses: Z97.3

## 2018-12-10 HISTORY — DX: Essential (primary) hypertension: I10

## 2018-12-10 HISTORY — PX: DILATATION & CURETTAGE/HYSTEROSCOPY WITH MYOSURE: SHX6511

## 2018-12-10 HISTORY — DX: Type 2 diabetes mellitus without complications: E11.9

## 2018-12-10 LAB — GLUCOSE, CAPILLARY: Glucose-Capillary: 94 mg/dL (ref 70–99)

## 2018-12-10 SURGERY — DILATATION & CURETTAGE/HYSTEROSCOPY WITH MYOSURE
Anesthesia: General | Site: Uterus

## 2018-12-10 MED ORDER — ARTIFICIAL TEARS OPHTHALMIC OINT
TOPICAL_OINTMENT | OPHTHALMIC | Status: AC
  Filled 2018-12-10: qty 3.5

## 2018-12-10 MED ORDER — HYDRALAZINE HCL 20 MG/ML IJ SOLN
INTRAMUSCULAR | Status: AC
  Filled 2018-12-10: qty 1

## 2018-12-10 MED ORDER — LIDOCAINE 2% (20 MG/ML) 5 ML SYRINGE
INTRAMUSCULAR | Status: DC | PRN
  Administered 2018-12-10: 60 mg via INTRAVENOUS

## 2018-12-10 MED ORDER — DEXAMETHASONE SODIUM PHOSPHATE 4 MG/ML IJ SOLN
INTRAMUSCULAR | Status: DC | PRN
  Administered 2018-12-10: 8 mg via INTRAVENOUS

## 2018-12-10 MED ORDER — LACTATED RINGERS IV SOLN
INTRAVENOUS | Status: DC
  Administered 2018-12-10: 06:00:00 1000 mL via INTRAVENOUS
  Filled 2018-12-10: qty 1000

## 2018-12-10 MED ORDER — FENTANYL CITRATE (PF) 100 MCG/2ML IJ SOLN
INTRAMUSCULAR | Status: AC
  Filled 2018-12-10: qty 2

## 2018-12-10 MED ORDER — PROPOFOL 10 MG/ML IV BOLUS
INTRAVENOUS | Status: AC
  Filled 2018-12-10: qty 40

## 2018-12-10 MED ORDER — ONDANSETRON HCL 4 MG/2ML IJ SOLN
INTRAMUSCULAR | Status: AC
  Filled 2018-12-10: qty 2

## 2018-12-10 MED ORDER — LIDOCAINE 2% (20 MG/ML) 5 ML SYRINGE
INTRAMUSCULAR | Status: AC
  Filled 2018-12-10: qty 5

## 2018-12-10 MED ORDER — FENTANYL CITRATE (PF) 100 MCG/2ML IJ SOLN
INTRAMUSCULAR | Status: DC | PRN
  Administered 2018-12-10: 25 ug via INTRAVENOUS
  Administered 2018-12-10: 50 ug via INTRAVENOUS
  Administered 2018-12-10: 25 ug via INTRAVENOUS

## 2018-12-10 MED ORDER — PROPOFOL 10 MG/ML IV BOLUS
INTRAVENOUS | Status: DC | PRN
  Administered 2018-12-10: 150 mg via INTRAVENOUS

## 2018-12-10 MED ORDER — KETOROLAC TROMETHAMINE 30 MG/ML IJ SOLN
INTRAMUSCULAR | Status: AC
  Filled 2018-12-10: qty 1

## 2018-12-10 MED ORDER — FENTANYL CITRATE (PF) 100 MCG/2ML IJ SOLN
25.0000 ug | INTRAMUSCULAR | Status: DC | PRN
  Filled 2018-12-10: qty 1

## 2018-12-10 MED ORDER — DEXAMETHASONE SODIUM PHOSPHATE 10 MG/ML IJ SOLN
INTRAMUSCULAR | Status: AC
  Filled 2018-12-10: qty 1

## 2018-12-10 MED ORDER — HYDRALAZINE HCL 20 MG/ML IJ SOLN
5.0000 mg | INTRAMUSCULAR | Status: DC | PRN
  Filled 2018-12-10: qty 0.25

## 2018-12-10 MED ORDER — PROMETHAZINE HCL 25 MG/ML IJ SOLN: 6.2500 mg | INTRAMUSCULAR | Status: DC | PRN

## 2018-12-10 MED ORDER — MIDAZOLAM HCL 2 MG/2ML IJ SOLN
INTRAMUSCULAR | Status: AC
  Filled 2018-12-10: qty 2

## 2018-12-10 MED ORDER — ONDANSETRON HCL 4 MG/2ML IJ SOLN
INTRAMUSCULAR | Status: DC | PRN
  Administered 2018-12-10: 4 mg via INTRAVENOUS

## 2018-12-10 MED ORDER — MIDAZOLAM HCL 5 MG/5ML IJ SOLN
INTRAMUSCULAR | Status: DC | PRN
  Administered 2018-12-10: 2 mg via INTRAVENOUS

## 2018-12-10 MED ORDER — SODIUM CHLORIDE 0.9 % IR SOLN
Status: DC | PRN
  Administered 2018-12-10: 3000 mL

## 2018-12-10 SURGICAL SUPPLY — 25 items
BIPOLAR CUTTING LOOP 21FR (ELECTRODE)
CANISTER SUCT 3000ML PPV (MISCELLANEOUS) ×1 IMPLANT
CATH ROBINSON RED A/P 16FR (CATHETERS) IMPLANT
COVER WAND RF STERILE (DRAPES) ×2 IMPLANT
DEVICE MYOSURE LITE (MISCELLANEOUS) IMPLANT
DEVICE MYOSURE REACH (MISCELLANEOUS) ×1 IMPLANT
DILATOR CANAL MILEX (MISCELLANEOUS) IMPLANT
GAUZE 4X4 16PLY RFD (DISPOSABLE) ×2 IMPLANT
GLOVE BIOGEL PI IND STRL 7.0 (GLOVE) ×1 IMPLANT
GLOVE BIOGEL PI INDICATOR 7.0 (GLOVE) ×1
GLOVE ECLIPSE 6.5 STRL STRAW (GLOVE) ×2 IMPLANT
GOWN STRL REUS W/TWL LRG LVL3 (GOWN DISPOSABLE) ×2 IMPLANT
IV NS IRRIG 3000ML ARTHROMATIC (IV SOLUTION) ×2 IMPLANT
KIT PROCEDURE FLUENT (KITS) ×2 IMPLANT
KIT TURNOVER CYSTO (KITS) ×2 IMPLANT
LOOP CUTTING BIPOLAR 21FR (ELECTRODE) IMPLANT
MYOSURE XL FIBROID (MISCELLANEOUS)
PACK VAGINAL MINOR WOMEN LF (CUSTOM PROCEDURE TRAY) ×2 IMPLANT
PAD OB MATERNITY 4.3X12.25 (PERSONAL CARE ITEMS) ×2 IMPLANT
PAD PREP 24X48 CUFFED NSTRL (MISCELLANEOUS) ×2 IMPLANT
SEAL CERVICAL OMNI LOK (ABLATOR) IMPLANT
SEAL ROD LENS SCOPE MYOSURE (ABLATOR) ×2 IMPLANT
SYSTEM TISS REMOVAL MYOSURE XL (MISCELLANEOUS) IMPLANT
TOWEL OR 17X26 10 PK STRL BLUE (TOWEL DISPOSABLE) ×2 IMPLANT
WATER STERILE IRR 500ML POUR (IV SOLUTION) ×1 IMPLANT

## 2018-12-10 NOTE — Brief Op Note (Signed)
12/10/2018  8:13 AM  PATIENT:  Sandra Sims  53 y.o. female  PRE-OPERATIVE DIAGNOSIS:  Postmenopausal Bleeding, Endometrial Thickening on sonogram  POST-OPERATIVE DIAGNOSIS:  Postmenopausal Bleeding, Endometrial Thickening on sonogram, SM fibroid  PROCEDURE:  Dx hysteroscopy, hysteroscopic resection of SM fibroid , D&C  SURGEON:  Surgeon(s) and Role:    * Servando Salina, MD - Primary  PHYSICIAN ASSISTANT:   ASSISTANTS: none   ANESTHESIA:   general Findings: endometrial thickening, sm ant SM fibroid, tubal ostia seen EBL:  5 mL   BLOOD ADMINISTERED:none  DRAINS: none   LOCAL MEDICATIONS USED:  NONE  SPECIMEN:  Source of Specimen:  emc with fibroid resection  DISPOSITION OF SPECIMEN:  PATHOLOGY  COUNTS:  YES  TOURNIQUET:  * No tourniquets in log *  DICTATION: .Other Dictation: Dictation Number [id number]  PLAN OF CARE: Discharge to home after PACU  PATIENT DISPOSITION:  PACU - hemodynamically stable.   Delay start of Pharmacological VTE agent (>24hrs) due to surgical blood loss or risk of bleeding: no

## 2018-12-10 NOTE — Op Note (Signed)
NAME: Sandra Sims, Sandra Sims MEDICAL RECORD [id number] ACCOUNT [id number] DATE OF BIRTH:April 15, 1966 FACILITY: [facility] LOCATION: [facility] PHYSICIAN:Taft Worthing A. Amarisa Wilinski, MD  OPERATIVE REPORT  DATE OF PROCEDURE:  12/10/2018  PREOPERATIVE DIAGNOSES:  Postmenopausal bleeding, endometrial thickening on sonogram, submucosal fibroid.  PROCEDURE PERFORMED:  Diagnostic hysteroscopy, hysteroscopic resection of submucosal fibroid, dilation and curettage.  POSTOPERATIVE DIAGNOSES:  Postmenopausal bleeding, endometrial thickening on sonogram, submucosal fibroid.  ANESTHESIA:  General.  SURGEON:  Servando Salina, MD  ASSISTANT:  None.  DESCRIPTION OF PROCEDURE:  Under adequate general anesthesia, the patient was placed in the dorsal lithotomy position.  She was sterilely prepped and draped in the usual fashion.  The bladder was catheterized, a moderate amount of urine.  Examination  under anesthesia revealed an anteverted uterus.  No adnexal masses could be appreciated.  Bivalve speculum was placed in the vagina.  A single-tooth tenaculum was placed on the anterior lip of the cervix.  The cervix was then serially dilated up to a #19  Pratt dilator.  A diagnostic hysteroscope was introduced into the uterine cavity.  Both tubal ostia could be seen.  The endocervical canal was without any lesions.  Anteriorly was a subtle small submucosal fibroid.  Using the Reach resectoscope, the  fibroid and the endometrial cavity were entirely resected.  When the resection was felt to be adequate, all instruments were then removed from the vagina.  SPECIMEN:  Labeled endometrial curetting with fibroid resection sent to pathology.  ESTIMATED BLOOD LOSS:  5 mL.  FLUID DEFICIT:  200 mL.  COUNTS:  Sponge and instrument count x2 was correct.  COMPLICATIONS:  None.  DISPOSITION:  The patient tolerated the procedure well and was transferred to recovery room in stable condition.  LN/NUANCE  D:12/10/2018  T:12/10/2018 JOB:[id number]

## 2018-12-10 NOTE — Anesthesia Postprocedure Evaluation (Signed)
Anesthesia Post Note  Patient: Sandra Sims  Procedure(s) Performed: DILATATION & CURETTAGE/HYSTEROSCOPY WITH MYOSURE (N/A Uterus)     Patient location during evaluation: PACU Anesthesia Type: General Level of consciousness: awake and alert Pain management: pain level controlled Vital Signs Assessment: post-procedure vital signs reviewed and stable Respiratory status: spontaneous breathing, nonlabored ventilation, respiratory function stable and patient connected to nasal cannula oxygen Cardiovascular status: blood pressure returned to baseline and stable Postop Assessment: no apparent nausea or vomiting Anesthetic complications: no    Last Vitals:  Vitals:   12/10/18 0916 12/10/18 0939  BP: (!) 158/100 (!) 154/99  Pulse: 66 67  Resp: 15 14  Temp:  36.8 C  SpO2: 100% 100%    Last Pain:  Vitals:   12/10/18 0939  TempSrc:   PainSc: 0-No pain                 Tiajuana Amass

## 2018-12-10 NOTE — Discharge Instructions (Signed)

## 2018-12-10 NOTE — H&P (Signed)
Sandra Sims is an 53 y.o. female. BF here for surgical mgmt of endometrial thickening and poss SM fibroid post evaluation for PMB.   Pertinent Gynecological History: Menses: post-menopausal Bleeding: post menopausal bleeding Contraception: none DES exposure: denies Blood transfusions: none Sexually transmitted diseases: no past history Previous GYN Procedures: TL  Last mammogram: normal Date: 2018 Last pap: normal Date:09/2017 OB History: [id number]   Menstrual History: Menarche age: n/Sandra  No LMP recorded (lmp unknown). Patient is postmenopausal.    Past Medical History:  Diagnosis Date  . Hypertension   . PMB (postmenopausal bleeding)   . Sickle cell trait ([place])   . Thickened endometrium   . Type 2 diabetes mellitus ([place])    followed by pcp  . Uterine fibroid   . Wears glasses     Past Surgical History:  Procedure Laterality Date  . DILATION AND CURETTAGE OF UTERUS  yrs ago  . IR GENERIC HISTORICAL  06/22/2016   IR RADIOLOGIST EVAL & MGMT 06/22/2016 [facility] INTERV RAD  . TUBAL LIGATION Bilateral yrs ago    Family History  Problem Relation Age of Onset  . Colon polyps Mother   . Stroke Father   . Colon cancer Neg Hx   . Esophageal cancer Neg Hx   . Rectal cancer Neg Hx   . Stomach cancer Neg Hx   . Liver disease Neg Hx   . Pancreatic cancer Neg Hx   . Prostate cancer Neg Hx     Social History:  reports that she quit smoking about 23 years ago. She has Sandra 5.50 pack-year smoking history. She has never used smokeless tobacco. She reports that she does not drink alcohol or use drugs.  Allergies: No Known Allergies  Medications Prior to Admission  Medication Sig Dispense Refill Last Dose  . BLACK ELDERBERRY,BERRY-FLOWER, PO Take by mouth daily.    12/09/2018 at Unknown time  . cholecalciferol (VITAMIN D) 1000 units tablet Take 1,000 Units by mouth daily.   12/09/2018 at Unknown time  . hydrochlorothiazide (MICROZIDE) 12.5 MG capsule Take 1 capsule (12.5 mg total) by  mouth daily. (Patient taking differently: Take 12.5 mg by mouth daily. ) 90 capsule 2 12/09/2018 at Unknown time  . ibuprofen (ADVIL,MOTRIN) 800 MG tablet Take 800 mg by mouth every 8 (eight) hours as needed.   Past Month at Unknown time  . Omega-3 Fatty Acids (FISH OIL PO) Take 1,000 mg by mouth daily.    12/09/2018 at Unknown time  . OVER THE COUNTER MEDICATION Apple cider vinegar 2 tsp daily   Past Week at Unknown time  . Semaglutide,0.25 or 0.5MG /DOS, (OZEMPIC, 0.25 OR 0.5 MG/DOSE,) 2 MG/1.5ML SOPN Inject 0.5 mg into the skin once Sandra week. (Patient taking differently: Inject 0.5 mg into the skin once Sandra week. wednesday's) 3 pen 1 12/04/2018  . BLACK CURRANT SEED OIL PO Take by mouth. 1 tsp daily   More than Sandra month at Unknown time  . Chlorphen-PE-Acetaminophen (NOREL AD) 4-10-325 MG TABS Take 1 tablet by mouth as needed.   More than Sandra month at Unknown time  . Chlorphen-Phenyleph-ASA (ALKA-SELTZER PLUS COLD PO) Take by mouth as needed.    More than Sandra month at Unknown time  . cyclobenzaprine (FLEXERIL) 10 MG tablet Take 10 mg by mouth at bedtime as needed.  0 More than Sandra month at Unknown time  . Dextromethorphan-guaiFENesin (CORICIDIN HBP CONGESTION/COUGH PO) Take by mouth as needed.    More than Sandra month at Unknown time  . magnesium  oxide (MAG-OX) 400 MG tablet Take 400 mg by mouth as needed.    More than Sandra month at Unknown time  . mometasone (NASONEX) 50 MCG/ACT nasal spray Place 2 sprays into the nose daily as needed.   2 More than Sandra month at Unknown time  . Multiple Vitamins-Minerals (ZINC PO) Take by mouth as needed.    More than Sandra month at Unknown time  . vitamin C (ASCORBIC ACID) 500 MG tablet Take 500 mg by mouth as needed.    More than Sandra month at Unknown time    Review of Systems  All other systems reviewed and are negative.   Blood pressure (!) 149/96, pulse 74, temperature 97.9 F (36.6 C), temperature source Oral, resp. rate 18, height 5\' 4"  (1.626 m), weight 84.6 kg, SpO2 100 %. Physical  Exam  Constitutional: She is oriented to person, place, and time. She appears well-developed and well-nourished.  HENT:  Head: Atraumatic.  Eyes: EOM are normal.  Neck: Neck supple.  Cardiovascular: Normal rate.  Respiratory: Breath sounds normal.  GI: Soft.  Genitourinary:    Vagina normal.     Genitourinary Comments: Cervix closed. Adnexa no palp mass  uterus sl enlarged   Neurological: She is alert and oriented to person, place, and time.  Skin: Skin is warm and dry.  Psychiatric: She has Sandra normal mood and affect.    No results found for this or any previous visit (from the past 24 hour(s)).  No results found.  Assessment/Plan: PMB Endometrial thickening on sonogram SM fibroid P) dx hysteroscopy, hysteroscopic resection of SM fibroid if present. , D&C. Risk of surgery reviewed including infection, bleeding, injury to surrounding organs, thermal injury, fluid overload and its mgmt, uterine perforation( 05/998) and it risk  Sandra Sims Sandra Sims 12/10/2018, 7:01 AM

## 2018-12-10 NOTE — Transfer of Care (Signed)
  Last Vitals:  Vitals Value Taken Time  BP 154/107 12/10/18 0816  Temp 36.4 C 12/10/18 0815  Pulse 91 12/10/18 0817  Resp 21 12/10/18 0817  SpO2 99 % 12/10/18 0817  Vitals shown include unvalidated device data.  Last Pain:  Vitals:   12/10/18 0612  TempSrc:   PainSc: 0-No pain      Patients Stated Pain Goal: 6 (12/10/18 1219)  Immediate Anesthesia Transfer of Care Note  Patient: Sandra Sims  Procedure(s) Performed: Procedure(s) (LRB): DILATATION & CURETTAGE/HYSTEROSCOPY WITH MYOSURE (N/A)  Patient Location: PACU  Anesthesia Type: General  Level of Consciousness: awake, alert  and oriented  Airway & Oxygen Therapy: Patient Spontanous Breathing and Patient connected to nasal cannula oxygen  Post-op Assessment: Report given to PACU RN and Post -op Vital signs reviewed and stable  Post vital signs: Reviewed and stable  Complications: No apparent anesthesia complications

## 2018-12-10 NOTE — Anesthesia Procedure Notes (Signed)
Procedure Name: LMA Insertion Date/Time: 12/10/2018 7:45 AM Performed by: Mechele Claude, CRNA Pre-anesthesia Checklist: Patient identified, Emergency Drugs available, Suction available and Patient being monitored Patient Re-evaluated:Patient Re-evaluated prior to induction Oxygen Delivery Method: Circle system utilized Preoxygenation: Pre-oxygenation with 100% oxygen Induction Type: IV induction Ventilation: Mask ventilation without difficulty LMA: LMA inserted LMA Size: 4.0 Number of attempts: 1 Airway Equipment and Method: Bite block Placement Confirmation: positive ETCO2 Tube secured with: Tape Dental Injury: Teeth and Oropharynx as per pre-operative assessment

## 2018-12-12 ENCOUNTER — Encounter (HOSPITAL_BASED_OUTPATIENT_CLINIC_OR_DEPARTMENT_OTHER): Payer: Self-pay | Admitting: Obstetrics and Gynecology

## 2018-12-25 MED FILL — OZEMPIC 0.25 OR 0.5 MG/DOSE: 2 | 28 days supply | Qty: 2 | Fill #1

## 2018-12-31 ENCOUNTER — Other Ambulatory Visit: Payer: Self-pay

## 2019-01-02 ENCOUNTER — Encounter: Payer: Self-pay | Admitting: Internal Medicine

## 2019-02-05 MED FILL — OZEMPIC 0.25 OR 0.5 MG/DOSE: 2 | 28 days supply | Qty: 2 | Fill #2

## 2019-02-07 LAB — HM DIABETES EYE EXAM

## 2019-02-10 ENCOUNTER — Encounter: Payer: Self-pay | Admitting: Internal Medicine

## 2019-02-17 ENCOUNTER — Other Ambulatory Visit: Payer: Self-pay | Admitting: Nurse Practitioner

## 2019-02-17 ENCOUNTER — Other Ambulatory Visit: Payer: Self-pay

## 2019-02-17 ENCOUNTER — Encounter: Payer: Self-pay | Admitting: Nurse Practitioner

## 2019-02-17 ENCOUNTER — Ambulatory Visit (INDEPENDENT_AMBULATORY_CARE_PROVIDER_SITE_OTHER): Payer: No Typology Code available for payment source | Admitting: Nurse Practitioner

## 2019-02-17 VITALS — BP 142/88 | HR 78 | Temp 98.4°F | Ht 64.6 in | Wt 189.4 lb

## 2019-02-17 DIAGNOSIS — R05 Cough: Secondary | ICD-10-CM | POA: Diagnosis not present

## 2019-02-17 DIAGNOSIS — J069 Acute upper respiratory infection, unspecified: Secondary | ICD-10-CM

## 2019-02-17 DIAGNOSIS — R059 Cough, unspecified: Secondary | ICD-10-CM

## 2019-02-17 MED ORDER — AZITHROMYCIN 250 MG PO TABS: ORAL_TABLET | ORAL | 0 refills | Status: AC

## 2019-02-17 MED ORDER — HYDROCODONE-HOMATROPINE 5-1.5 MG/5ML PO SYRP: 5.0000 mL | ORAL_SOLUTION | Freq: Four times a day (QID) | ORAL | 0 refills | Status: DC | PRN

## 2019-02-17 MED FILL — HYDROCODONE-HOMATROPINE SOL: 5-1.5 | 6 days supply | Qty: 120 | Fill #0

## 2019-02-17 MED FILL — AZITHROMYCIN 250 MG TABLET: 250 | 5 days supply | Qty: 6 | Fill #0

## 2019-02-17 NOTE — Patient Instructions (Addendum)
Upper Respiratory Infection, Adult An upper respiratory infection (URI) affects the nose, throat, and upper air passages. URIs are caused by germs (viruses). The most common type of URI is often called "the common cold." Medicines cannot cure URIs, but you can do things at home to relieve your symptoms. URIs usually get better within 7-10 days. Follow these instructions at home: Activity  Rest as needed.  If you have a fever, stay home from work or school until your fever is gone, or until your doctor says you may return to work or school. ? You should stay home until you cannot spread the infection anymore (you are not contagious). ? Your doctor may have you wear a face mask so you have less risk of spreading the infection. Relieving symptoms  Gargle with a salt-water mixture 3-4 times a day or as needed. To make a salt-water mixture, completely dissolve -1 tsp of salt in 1 cup of warm water.  Use a cool-mist humidifier to add moisture to the air. This can help you breathe more easily. Eating and drinking   Drink enough fluid to keep your pee (urine) pale yellow.  Eat soups and other clear broths. General instructions   Take over-the-counter and prescription medicines only as told by your doctor. These include cold medicines, fever reducers, and cough suppressants.  Do not use any products that contain nicotine or tobacco. These include cigarettes and e-cigarettes. If you need help quitting, ask your doctor.  Avoid being where people are smoking (avoid secondhand smoke).  Make sure you get regular shots and get the flu shot every year.  Keep all follow-up visits as told by your doctor. This is important. How to avoid spreading infection to others   Wash your hands often with soap and water. If you do not have soap and water, use hand sanitizer.  Avoid touching your mouth, face, eyes, or nose.  Cough or sneeze into a tissue or your sleeve or elbow. Do not cough or sneeze  into your hand or into the air. Contact a doctor if:  You are getting worse, not better.  You have any of these: ? A fever. ? Chills. ? Brown or red mucus in your nose. ? Yellow or brown fluid (discharge)coming from your nose. ? Pain in your face, especially when you bend forward. ? Swollen neck glands. ? Pain with swallowing. ? White areas in the back of your throat. Get help right away if:  You have shortness of breath that gets worse.  You have very bad or constant: ? Headache. ? Ear pain. ? Pain in your forehead, behind your eyes, and over your cheekbones (sinus pain). ? Chest pain.  You have long-lasting (chronic) lung disease along with any of these: ? Wheezing. ? Long-lasting cough. ? Coughing up blood. ? A change in your usual mucus.  You have a stiff neck.  You have changes in your: ? Vision. ? Hearing. ? Thinking. ? Mood. Summary  An upper respiratory infection (URI) is caused by a germ called a virus. The most common type of URI is often called "the common cold."  URIs usually get better within 7-10 days.  Take over-the-counter and prescription medicines only as told by your doctor. This information is not intended to replace advice given to you by your health care provider. Make sure you discuss any questions you have with your health care provider. Document Released: 10/11/2007 Document Revised: 05/02/2018 Document Reviewed: 12/15/2016 Elsevier Patient Education  2020 [employer].  Cough, Adult A cough helps to clear your throat and lungs. A cough may be a sign of an illness or another medical condition. An acute cough may only last 2-3 weeks, while a chronic cough may last 8 or more weeks. Many things can cause a cough. They include:  Germs (viruses or bacteria) that attack the airway.  Breathing in things that bother (irritate) your lungs.  Allergies.  Asthma.  Mucus that runs down the back of your throat (postnasal drip).  Smoking.   Acid backing up from the stomach into the tube that moves food from the mouth to the stomach (gastroesophageal reflux).  Some medicines.  Lung problems.  Other medical conditions, such as heart failure or a blood clot in the lung (pulmonary embolism). Follow these instructions at home: Medicines  Take over-the-counter and prescription medicines only as told by your doctor.  Talk with your doctor before you take medicines that stop a cough (coughsuppressants). Lifestyle   Do not smoke, and try not to be around smoke. Do not use any products that contain nicotine or tobacco, such as cigarettes, e-cigarettes, and chewing tobacco. If you need help quitting, ask your doctor.  Drink enough fluid to keep your pee (urine) pale yellow.  Avoid caffeine.  Do not drink alcohol if your doctor tells you not to drink. General instructions   Watch for any changes in your cough. Tell your doctor about them.  Always cover your mouth when you cough.  Stay away from things that make you cough, such as perfume, candles, campfire smoke, or cleaning products.  If the air is dry, use a cool mist vaporizer or humidifier in your home.  If your cough is worse at night, try using extra pillows to raise your head up higher while you sleep.  Rest as needed.  Keep all follow-up visits as told by your doctor. This is important. Contact a doctor if:  You have new symptoms.  You cough up pus.  Your cough does not get better after 2-3 weeks, or your cough gets worse.  Cough medicine does not help your cough and you are not sleeping well.  You have pain that gets worse or pain that is not helped with medicine.  You have a fever.  You are losing weight and you do not know why.  You have night sweats. Get help right away if:  You cough up blood.  You have trouble breathing.  Your heartbeat is very fast. These symptoms may be an emergency. Do not wait to see if the symptoms will go away. Get  medical help right away. Call your local emergency services (911 in the [place] Do not drive yourself to the hospital. Summary  A cough helps to clear your throat and lungs. Many things can cause a cough.  Take over-the-counter and prescription medicines only as told by your doctor.  Always cover your mouth when you cough.  Contact a doctor if you have new symptoms or you have a cough that does not get better or gets worse. This information is not intended to replace advice given to you by your health care provider. Make sure you discuss any questions you have with your health care provider. Document Released: 01/05/2011 Document Revised: 05/13/2018 Document Reviewed: 05/13/2018 Elsevier Patient Education  [place] sure to self isolate until your results are back for coronavirus  If you have shortness of breath go to ER for further treatment

## 2019-02-17 NOTE — Progress Notes (Signed)
Subjective:     Patient ID: Sandra Sims , female    DOB: 05-16-1965 , 53 y.o.   MRN: [id number]   Chief Complaint  Patient presents with  . URI    patient would like to be tested for covid    HPI  She was around her grandson who was sick with a cold.   URI  This is a new problem. The current episode started in the past 7 days (since last Wednesday). There has been no fever. Associated symptoms include congestion and coughing (productive cough). Pertinent negatives include no abdominal pain, headaches, nausea, sinus pain, sore throat or vomiting. Treatments tried: alka seltzer and nyquil.     Past Medical History:  Diagnosis Date  . Hypertension   . PMB (postmenopausal bleeding)   . Sickle cell trait ([place])   . Thickened endometrium   . Type 2 diabetes mellitus ([place])    followed by pcp  . Uterine fibroid   . Wears glasses      Family History  Problem Relation Age of Onset  . Colon polyps Mother   . Stroke Father   . Colon cancer Neg Hx   . Esophageal cancer Neg Hx   . Rectal cancer Neg Hx   . Stomach cancer Neg Hx   . Liver disease Neg Hx   . Pancreatic cancer Neg Hx   . Prostate cancer Neg Hx      Current Outpatient Medications:  .  BLACK CURRANT SEED OIL PO, Take by mouth. 1 tsp daily, Disp: , Rfl:  .  BLACK ELDERBERRY,BERRY-FLOWER, PO, Take by mouth daily. , Disp: , Rfl:  .  Chlorphen-PE-Acetaminophen (NOREL AD) 4-10-325 MG TABS, Take 1 tablet by mouth as needed., Disp: , Rfl:  .  Chlorphen-Phenyleph-ASA (ALKA-SELTZER PLUS COLD PO), Take by mouth as needed. , Disp: , Rfl:  .  cholecalciferol (VITAMIN D) 1000 units tablet, Take 1,000 Units by mouth daily., Disp: , Rfl:  .  cyclobenzaprine (FLEXERIL) 10 MG tablet, Take 10 mg by mouth at bedtime as needed., Disp: , Rfl: 0 .  Dextromethorphan-guaiFENesin (CORICIDIN HBP CONGESTION/COUGH PO), Take by mouth as needed. , Disp: , Rfl:  .  hydrochlorothiazide (MICROZIDE) 12.5 MG capsule, Take 1 capsule (12.5 mg  total) by mouth daily. (Patient taking differently: Take 12.5 mg by mouth daily. ), Disp: 90 capsule, Rfl: 2 .  ibuprofen (ADVIL,MOTRIN) 800 MG tablet, Take 800 mg by mouth every 8 (eight) hours as needed., Disp: , Rfl:  .  magnesium oxide (MAG-OX) 400 MG tablet, Take 400 mg by mouth as needed. , Disp: , Rfl:  .  mometasone (NASONEX) 50 MCG/ACT nasal spray, Place 2 sprays into the nose daily as needed. , Disp: , Rfl: 2 .  Multiple Vitamins-Minerals (ZINC PO), Take by mouth as needed. , Disp: , Rfl:  .  Omega-3 Fatty Acids (FISH OIL PO), Take 1,000 mg by mouth daily. , Disp: , Rfl:  .  OVER THE COUNTER MEDICATION, Apple cider vinegar 2 tsp daily, Disp: , Rfl:  .  Semaglutide,0.25 or 0.5MG /DOS, (OZEMPIC, 0.25 OR 0.5 MG/DOSE,) 2 MG/1.5ML SOPN, Inject 0.5 mg into the skin once a week. (Patient taking differently: Inject 0.5 mg into the skin once a week. wednesday's), Disp: 3 pen, Rfl: 1 .  vitamin C (ASCORBIC ACID) 500 MG tablet, Take 500 mg by mouth as needed. , Disp: , Rfl:    No Known Allergies   Review of Systems  Constitutional: Negative.   HENT: Positive for congestion.  Negative for sinus pain and sore throat.   Respiratory: Positive for cough (productive cough). Negative for choking and shortness of breath.   Cardiovascular: Negative.   Gastrointestinal: Negative for abdominal pain, nausea and vomiting.  Neurological: Negative for dizziness and headaches.  Psychiatric/Behavioral: Negative.      Today's Vitals   02/17/19 0854  BP: (!) 142/88  Pulse: 78  Temp: 98.4 F (36.9 C)  TempSrc: Oral  Weight: 189 lb 6.4 oz (85.9 kg)  Height: 5' 4.6" (1.641 m)  PainSc: 0-No pain   Body mass index is 31.91 kg/m.   Objective:  Physical Exam Vitals signs reviewed.  Constitutional:      Appearance: Normal appearance.  HENT:     Nose:     Right Sinus: No frontal sinus tenderness.     Left Sinus: No frontal sinus tenderness.  Eyes:     Pupils: Pupils are equal, round, and reactive to  light.  Cardiovascular:     Rate and Rhythm: Normal rate and regular rhythm.     Pulses: Normal pulses.     Heart sounds: Normal heart sounds. No murmur.  Pulmonary:     Effort: Pulmonary effort is normal. No respiratory distress.     Breath sounds: Normal breath sounds. No wheezing or rhonchi.  Skin:    General: Skin is warm and dry.  Neurological:     General: No focal deficit present.     Mental Status: She is alert and oriented to person, place, and time.  Psychiatric:        Mood and Affect: Mood normal.        Behavior: Behavior normal.        Thought Content: Thought content normal.        Judgment: Judgment normal.         Assessment And Plan:    1. Cough  Five day history of productive cough and cold symptoms  Worse at night  Will check coronavirus   She is advised to self isolate until the results have been received  - azithromycin (ZITHROMAX) 250 MG tablet; Take 2 tablets (500 mg) on  Day 1,  followed by 1 tablet (250 mg) once daily on Days 2 through 5.  Dispense: 6 each; Refill: 0 - Novel Coronavirus, NAA ([facility])  2. Upper respiratory tract infection, unspecified type  Will treat with antibiotic due to the persistent cough  Lung sounds are clear - azithromycin (ZITHROMAX) 250 MG tablet; Take 2 tablets (500 mg) on  Day 1,  followed by 1 tablet (250 mg) once daily on Days 2 through 5.  Dispense: 6 each; Refill: 0 - Novel Coronavirus, NAA ([facility])   Minette Brine, FNP    THE PATIENT IS ENCOURAGED TO PRACTICE SOCIAL DISTANCING DUE TO THE COVID-19 PANDEMIC.

## 2019-02-18 LAB — NOVEL CORONAVIRUS, NAA: SARS-CoV-2, NAA: NOT DETECTED

## 2019-02-27 ENCOUNTER — Encounter: Payer: Self-pay | Admitting: Internal Medicine

## 2019-04-01 ENCOUNTER — Other Ambulatory Visit: Payer: Self-pay

## 2019-04-01 DIAGNOSIS — Z139 Encounter for screening, unspecified: Secondary | ICD-10-CM

## 2019-04-02 LAB — NOVEL CORONAVIRUS, NAA: SARS-CoV-2, NAA: NOT DETECTED

## 2019-04-07 MED FILL — HYDROCHLOROTHIAZIDE 12.5 MG: 12.5 | 90 days supply | Qty: 90 | Fill #2

## 2019-05-13 MED FILL — OZEMPIC 0.25 OR 0.5 MG/DOSE: 2 | 28 days supply | Qty: 2 | Fill #3

## 2019-07-03 ENCOUNTER — Encounter: Payer: No Typology Code available for payment source | Admitting: Nurse Practitioner

## 2019-07-03 ENCOUNTER — Encounter: Payer: Self-pay | Admitting: Nurse Practitioner

## 2019-07-03 ENCOUNTER — Other Ambulatory Visit: Payer: Self-pay

## 2019-07-04 NOTE — Progress Notes (Signed)
Patient changed her mind

## 2019-07-14 ENCOUNTER — Encounter: Payer: Self-pay | Admitting: Internal Medicine

## 2019-07-14 ENCOUNTER — Ambulatory Visit (INDEPENDENT_AMBULATORY_CARE_PROVIDER_SITE_OTHER): Payer: No Typology Code available for payment source | Admitting: Internal Medicine

## 2019-07-14 ENCOUNTER — Other Ambulatory Visit: Payer: Self-pay

## 2019-07-14 VITALS — BP 132/88 | HR 60 | Temp 98.3°F | Ht 64.6 in | Wt 191.0 lb

## 2019-07-14 DIAGNOSIS — I10 Essential (primary) hypertension: Secondary | ICD-10-CM

## 2019-07-14 DIAGNOSIS — Z23 Encounter for immunization: Secondary | ICD-10-CM

## 2019-07-14 DIAGNOSIS — M79631 Pain in right forearm: Secondary | ICD-10-CM

## 2019-07-14 DIAGNOSIS — E1165 Type 2 diabetes mellitus with hyperglycemia: Secondary | ICD-10-CM | POA: Diagnosis not present

## 2019-07-14 LAB — BMP8+EGFR
BUN/Creatinine Ratio: 12 (ref 9–23)
BUN: 8 mg/dL (ref 6–24)
CO2: 27 mmol/L (ref 20–29)
Calcium: 10.1 mg/dL (ref 8.7–10.2)
Chloride: 102 mmol/L (ref 96–106)
Creatinine, Ser: 0.68 mg/dL (ref 0.57–1.00)
GFR calc Af Amer: 115 mL/min/{1.73_m2} (ref >59)
GFR calc non Af Amer: 100 mL/min/{1.73_m2} (ref >59)
Glucose: 95 mg/dL (ref 65–99)
Potassium: 4.1 mmol/L (ref 3.5–5.2)
Sodium: 144 mmol/L (ref 134–144)

## 2019-07-14 LAB — HEMOGLOBIN A1C
Est. average glucose Bld gHb Est-mCnc: 137 mg/dL
Hgb A1c MFr Bld: 6.4 % — ABNORMAL HIGH (ref 4.8–5.6)

## 2019-07-14 MED ORDER — CYCLOBENZAPRINE HCL 10 MG PO TABS: 10.0000 mg | ORAL_TABLET | Freq: Every evening | ORAL | 0 refills | Status: DC | PRN

## 2019-07-14 MED FILL — CYCLOBENZAPRINE HCL 10 MG T: 10 | 30 days supply | Qty: 30 | Fill #0

## 2019-07-14 NOTE — Patient Instructions (Signed)

## 2019-07-28 MED FILL — IBUPROFEN 800 MG TABS: 800 | 20 days supply | Qty: 60 | Fill #1

## 2019-08-06 NOTE — Progress Notes (Signed)
This visit occurred during the SARS-CoV-2 public health emergency.  Safety protocols were in place, including screening questions prior to the visit, additional usage of staff PPE, and extensive cleaning of exam room while observing appropriate contact time as indicated for disinfecting solutions.  Subjective:     Patient ID: Sandra Sims , female    DOB: 1966-01-05 , 54 y.o.   MRN: [id number]   Chief Complaint  Patient presents with  . Arm Pain    right  . Diabetes  . Hypertension    HPI  She presents today for evaluation of right arm pain. Denies fall/trauma. She has been working out more frequently. She reportsshe is awar  Arm Pain  The incident occurred 3 to 5 days ago. The incident occurred at the gym. The injury mechanism was repetitive motion. The pain is present in the right forearm.  Diabetes She presents for her follow-up diabetic visit. She has type 2 diabetes mellitus. Her disease course has been stable. There are no hypoglycemic associated symptoms. There are no diabetic associated symptoms. There are no hypoglycemic complications. Symptoms are stable. There are no diabetic complications.  Hypertension This is a chronic problem. The current episode started more than 1 month ago. The problem has been rapidly improving since onset. The problem is controlled.     Past Medical History:  Diagnosis Date  . Hypertension   . PMB (postmenopausal bleeding)   . Sickle cell trait ([place])   . Thickened endometrium   . Type 2 diabetes mellitus ([place])    followed by pcp  . Uterine fibroid   . Wears glasses      Family History  Problem Relation Age of Onset  . Colon polyps Mother   . Stroke Father   . Colon cancer Neg Hx   . Esophageal cancer Neg Hx   . Rectal cancer Neg Hx   . Stomach cancer Neg Hx   . Liver disease Neg Hx   . Pancreatic cancer Neg Hx   . Prostate cancer Neg Hx      Current Outpatient Medications:  .  BLACK ELDERBERRY,BERRY-FLOWER, PO, Take by  mouth daily. , Disp: , Rfl:  .  Chlorphen-PE-Acetaminophen (NOREL AD) 4-10-325 MG TABS, Take 1 tablet by mouth as needed., Disp: , Rfl:  .  Chlorphen-Phenyleph-ASA (ALKA-SELTZER PLUS COLD PO), Take by mouth as needed. , Disp: , Rfl:  .  cholecalciferol (VITAMIN D) 1000 units tablet, Take 1,000 Units by mouth daily., Disp: , Rfl:  .  cyclobenzaprine (FLEXERIL) 10 MG tablet, Take 1 tablet (10 mg total) by mouth at bedtime as needed., Disp: 30 tablet, Rfl: 0 .  Dextromethorphan-guaiFENesin (CORICIDIN HBP CONGESTION/COUGH PO), Take by mouth as needed. , Disp: , Rfl:  .  hydrochlorothiazide (MICROZIDE) 12.5 MG capsule, Take 1 capsule (12.5 mg total) by mouth daily. (Patient taking differently: Take 12.5 mg by mouth daily. ), Disp: 90 capsule, Rfl: 2 .  HYDROcodone-homatropine (HYDROMET) 5-1.5 MG/5ML syrup, Take 5 mLs by mouth every 6 (six) hours as needed., Disp: 120 mL, Rfl: 0 .  ibuprofen (ADVIL,MOTRIN) 800 MG tablet, Take 800 mg by mouth every 8 (eight) hours as needed., Disp: , Rfl:  .  NON FORMULARY, Sea moss, Disp: , Rfl:  .  Omega-3 Fatty Acids (FISH OIL PO), Take 1,000 mg by mouth daily. , Disp: , Rfl:  .  OVER THE COUNTER MEDICATION, Apple cider vinegar 2 tsp daily, Disp: , Rfl:  .  Semaglutide,0.25 or 0.5MG/DOS, (OZEMPIC, 0.25 OR 0.5 MG/DOSE,) 2  MG/1.5ML SOPN, Inject 0.5 mg into the skin once a week. (Patient taking differently: Inject 0.5 mg into the skin once a week. wednesday's), Disp: 3 pen, Rfl: 1 .  vitamin C (ASCORBIC ACID) 500 MG tablet, Take 500 mg by mouth as needed. , Disp: , Rfl:  .  magnesium oxide (MAG-OX) 400 MG tablet, Take 400 mg by mouth as needed. , Disp: , Rfl:  .  mometasone (NASONEX) 50 MCG/ACT nasal spray, Place 2 sprays into the nose daily as needed. , Disp: , Rfl: 2   No Known Allergies   Review of Systems  Constitutional: Negative.   Respiratory: Negative.   Cardiovascular: Negative.   Gastrointestinal: Negative.   Musculoskeletal: Positive for arthralgias.        She c/o r arm pain.   Neurological: Negative.   Psychiatric/Behavioral: Negative.      Today's Vitals   07/14/19 0929  BP: 132/88  Pulse: 60  Temp: 98.3 F (36.8 C)  TempSrc: Oral  Weight: 191 lb (86.6 kg)  Height: 5' 4.6" (1.641 m)  PainSc: 8   PainLoc: Arm   Body mass index is 32.18 kg/m.   Objective:  Physical Exam Vitals and nursing note reviewed.  Constitutional:      Appearance: Normal appearance.  HENT:     Head: Normocephalic and atraumatic.  Cardiovascular:     Rate and Rhythm: Normal rate and regular rhythm.     Heart sounds: Normal heart sounds.  Pulmonary:     Effort: Pulmonary effort is normal.     Breath sounds: Normal breath sounds.  Musculoskeletal:     Comments: r forearm is tender to palpation. No overlying erythema. There is pain with flexion.   Skin:    General: Skin is warm.  Neurological:     General: No focal deficit present.     Mental Status: She is alert.  Psychiatric:        Mood and Affect: Mood normal.        Behavior: Behavior normal.         Assessment And Plan:     1. Right forearm pain  Her sx appear to be musculoskeletal in nature. I will send rx cyclobenzaprine to the pharmacy. She will also take Aleve prn. She will let me know if her sx persist. Also advised to apply topical pain cream to affected area.   2. Uncontrolled type 2 diabetes mellitus with hyperglycemia (HCC)  Chronic, will check labs as listed below. Will adjust meds as needed. Encouraged to exercise 30 minutes five days per week.   - Hemoglobin A1c - BMP8+EGFR  3. Essential hypertension, benign  Chronic, fair control. She will continue with current meds for now.   4. Immunization due  She was given pneumovax-23 Im x 1.   Robyn N Sanders, MD    THE PATIENT IS ENCOURAGED TO PRACTICE SOCIAL DISTANCING DUE TO THE COVID-19 PANDEMIC.   

## 2019-08-18 ENCOUNTER — Other Ambulatory Visit: Payer: Self-pay | Admitting: Internal Medicine

## 2019-08-18 MED FILL — HYDROCHLOROTHIAZIDE 12.5 MG: 12.5 | 90 days supply | Qty: 90 | Fill #0

## 2019-08-19 ENCOUNTER — Other Ambulatory Visit: Payer: Self-pay

## 2019-08-19 ENCOUNTER — Ambulatory Visit (INDEPENDENT_AMBULATORY_CARE_PROVIDER_SITE_OTHER): Payer: No Typology Code available for payment source | Admitting: Internal Medicine

## 2019-08-19 ENCOUNTER — Encounter: Payer: Self-pay | Admitting: Internal Medicine

## 2019-08-19 VITALS — BP 138/84 | HR 80 | Temp 98.3°F | Ht 64.6 in | Wt 189.8 lb

## 2019-08-19 DIAGNOSIS — Z6831 Body mass index (BMI) 31.0-31.9, adult: Secondary | ICD-10-CM | POA: Diagnosis not present

## 2019-08-19 DIAGNOSIS — R0982 Postnasal drip: Secondary | ICD-10-CM | POA: Diagnosis not present

## 2019-08-19 DIAGNOSIS — J029 Acute pharyngitis, unspecified: Secondary | ICD-10-CM | POA: Diagnosis not present

## 2019-08-19 DIAGNOSIS — E6609 Other obesity due to excess calories: Secondary | ICD-10-CM

## 2019-08-22 NOTE — Patient Instructions (Signed)

## 2019-08-22 NOTE — Progress Notes (Signed)
This visit occurred during the SARS-CoV-2 public health emergency.  Safety protocols were in place, including screening questions prior to the visit, additional usage of staff PPE, and extensive cleaning of exam room while observing appropriate contact time as indicated for disinfecting solutions.  Subjective:     Patient ID: Sandra Sims , female    DOB: 1965/08/04 , 53 y.o.   MRN: [id number]   Chief Complaint  Patient presents with  . Sore Throat    HPI  She is here today for further evaluation of a sore throat.  She denies fever/chills. She feels as if something is stuck in her throat. Her sx started last summer. She is unable to determine what triggers her sx. Feels as if she has a "tickle" in her throat. Over the weekend, she took capful of olive oil with some relief of her sx. She denies ill contacts.   Sore Throat  This is a recurrent problem. The current episode started more than 1 month ago. The problem has been gradually worsening. Neither side of throat is experiencing more pain than the other. There has been no fever. Pertinent negatives include no abdominal pain.     Past Medical History:  Diagnosis Date  . Hypertension   . PMB (postmenopausal bleeding)   . Sickle cell trait ([place])   . Thickened endometrium   . Type 2 diabetes mellitus ([place])    followed by pcp  . Uterine fibroid   . Wears glasses      Family History  Problem Relation Age of Onset  . Colon polyps Mother   . Stroke Father   . Colon cancer Neg Hx   . Esophageal cancer Neg Hx   . Rectal cancer Neg Hx   . Stomach cancer Neg Hx   . Liver disease Neg Hx   . Pancreatic cancer Neg Hx   . Prostate cancer Neg Hx      Current Outpatient Medications:  .  BLACK ELDERBERRY,BERRY-FLOWER, PO, Take by mouth daily. , Disp: , Rfl:  .  Chlorphen-PE-Acetaminophen (NOREL AD) 4-10-325 MG TABS, Take 1 tablet by mouth as needed., Disp: , Rfl:  .  Chlorphen-Phenyleph-ASA (ALKA-SELTZER PLUS COLD PO), Take by  mouth as needed. , Disp: , Rfl:  .  cholecalciferol (VITAMIN D) 1000 units tablet, Take 1,000 Units by mouth daily., Disp: , Rfl:  .  cyclobenzaprine (FLEXERIL) 10 MG tablet, Take 1 tablet (10 mg total) by mouth at bedtime as needed., Disp: 30 tablet, Rfl: 0 .  Dextromethorphan-guaiFENesin (CORICIDIN HBP CONGESTION/COUGH PO), Take by mouth as needed. , Disp: , Rfl:  .  hydrochlorothiazide (MICROZIDE) 12.5 MG capsule, TAKE 1 CAPSULE (12.5 MG TOTAL) BY MOUTH DAILY., Disp: 90 capsule, Rfl: 1 .  HYDROcodone-homatropine (HYDROMET) 5-1.5 MG/5ML syrup, Take 5 mLs by mouth every 6 (six) hours as needed., Disp: 120 mL, Rfl: 0 .  ibuprofen (ADVIL,MOTRIN) 800 MG tablet, Take 800 mg by mouth every 8 (eight) hours as needed., Disp: , Rfl:  .  magnesium oxide (MAG-OX) 400 MG tablet, Take 400 mg by mouth as needed. , Disp: , Rfl:  .  mometasone (NASONEX) 50 MCG/ACT nasal spray, Place 2 sprays into the nose daily as needed. , Disp: , Rfl: 2 .  NON FORMULARY, Sea moss, Disp: , Rfl:  .  Omega-3 Fatty Acids (FISH OIL PO), Take 1,000 mg by mouth daily. , Disp: , Rfl:  .  OVER THE COUNTER MEDICATION, Apple cider vinegar 2 tsp daily, Disp: , Rfl:  .  Semaglutide,0.25 or 0.5MG /DOS, (OZEMPIC, 0.25 OR 0.5 MG/DOSE,) 2 MG/1.5ML SOPN, Inject 0.5 mg into the skin once a week. (Patient taking differently: Inject 0.5 mg into the skin once a week. wednesday's), Disp: 3 pen, Rfl: 1 .  vitamin C (ASCORBIC ACID) 500 MG tablet, Take 500 mg by mouth as needed. , Disp: , Rfl:    No Known Allergies   Review of Systems  Constitutional: Negative.   HENT: Positive for sore throat.   Respiratory: Negative.   Cardiovascular: Negative.   Gastrointestinal: Negative.  Negative for abdominal pain.  Neurological: Negative.   Psychiatric/Behavioral: Negative.      Today's Vitals   08/19/19 0950  BP: 138/84  Pulse: 80  Temp: 98.3 F (36.8 C)  Weight: 189 lb 12.8 oz (86.1 kg)  Height: 5' 4.6" (1.641 m)   Body mass index is 31.98  kg/m.   Objective:  Physical Exam Vitals and nursing note reviewed.  Constitutional:      Appearance: Normal appearance.  HENT:     Head: Normocephalic and atraumatic.     Right Ear: Tympanic membrane and ear canal normal. No tenderness. No middle ear effusion.     Left Ear: Tympanic membrane and ear canal normal. No tenderness.  No middle ear effusion.     Mouth/Throat:     Pharynx: Oropharynx is clear. Posterior oropharyngeal erythema present. No pharyngeal swelling or oropharyngeal exudate.  Cardiovascular:     Rate and Rhythm: Normal rate and regular rhythm.     Heart sounds: Normal heart sounds.  Pulmonary:     Effort: Pulmonary effort is normal.     Breath sounds: Normal breath sounds.  Skin:    General: Skin is warm.  Neurological:     General: No focal deficit present.     Mental Status: She is alert.  Psychiatric:        Mood and Affect: Mood normal.        Behavior: Behavior normal.         Assessment And Plan:     1. Sore throat  She agrees with ENT evaluation. I will place referral. I think her sx could be due to postnasal drip, but advised pt further evaluation is warranted at this time.   - Ambulatory referral to ENT  2. Class 1 obesity due to excess calories with serious comorbidity and body mass index (BMI) of 31.0 to 31.9 in adult  She is encouraged to lose 7-10 pounds to decrease cardiac risk. Advised to exercise at least 150 minutes per week.   3. Postnasal drip  She may benefit from cetirizine nightly.  [name], MD    THE PATIENT IS ENCOURAGED TO PRACTICE SOCIAL DISTANCING DUE TO THE COVID-19 PANDEMIC.

## 2019-09-19 ENCOUNTER — Encounter (INDEPENDENT_AMBULATORY_CARE_PROVIDER_SITE_OTHER): Payer: Self-pay | Admitting: Otolaryngology

## 2019-09-19 ENCOUNTER — Other Ambulatory Visit: Payer: Self-pay

## 2019-09-19 ENCOUNTER — Ambulatory Visit (INDEPENDENT_AMBULATORY_CARE_PROVIDER_SITE_OTHER): Payer: No Typology Code available for payment source | Admitting: Otolaryngology

## 2019-09-19 VITALS — Temp 97.5°F

## 2019-09-19 DIAGNOSIS — J029 Acute pharyngitis, unspecified: Secondary | ICD-10-CM | POA: Diagnosis not present

## 2019-09-19 NOTE — Progress Notes (Signed)
HPI: Sandra Sims is a 54 y.o. female who presents is referred by Dr. Baird Cancer for evaluation of chronic left-sided throat discomfort.  She has had this for about 10 months now where she feels like something is stuck in her throat on the left side in the region of the tonsil.  She points high in her neck just below the angle of the jaw.  She does not really describe a lot of throat pain or discomfort.  She does not have any trouble swallowing or eating.  She has had no hoarseness. She quit smoking over 20 years ago..  Past Medical History:  Diagnosis Date  . Hypertension   . PMB (postmenopausal bleeding)   . Sickle cell trait ([place])   . Thickened endometrium   . Type 2 diabetes mellitus ([place])    followed by pcp  . Uterine fibroid   . Wears glasses    Past Surgical History:  Procedure Laterality Date  . DILATATION & CURETTAGE/HYSTEROSCOPY WITH MYOSURE N/A 12/10/2018   Procedure: [place];  Surgeon: Servando Salina, MD;  Location: [facility];  Service: Gynecology;  Laterality: N/A;  . DILATION AND CURETTAGE OF UTERUS  yrs ago  . IR GENERIC HISTORICAL  06/22/2016   IR RADIOLOGIST EVAL & MGMT 06/22/2016 [facility] INTERV RAD  . TUBAL LIGATION Bilateral yrs ago   Social History   Socioeconomic History  . Marital status: Married    Spouse name: Not on file  . Number of children: Not on file  . Years of education: Not on file  . Highest education level: Not on file  Occupational History  . Not on file  Tobacco Use  . Smoking status: Former Smoker    Packs/day: 0.50    Years: 11.00    Pack years: 5.50    Quit date: 02/06/1995    Years since quitting: 24.6  . Smokeless tobacco: Never Used  Substance and Sexual Activity  . Alcohol use: No  . Drug use: Never  . Sexual activity: Yes    Birth control/protection: Surgical  Other Topics Concern  . Not on file  Social History Narrative  . Not on file   Social Determinants of  Health   Financial Resource Strain:   . Difficulty of Paying Living Expenses:   Food Insecurity:   . Worried About Charity fundraiser in the Last Year:   . Arboriculturist in the Last Year:   Transportation Needs:   . Film/video editor (Medical):   Marland Kitchen Lack of Transportation (Non-Medical):   Physical Activity:   . Days of Exercise per Week:   . Minutes of Exercise per Session:   Stress:   . Feeling of Stress :   Social Connections:   . Frequency of Communication with Friends and Family:   . Frequency of Social Gatherings with Friends and Family:   . Attends Religious Services:   . Active Member of Clubs or Organizations:   . Attends Archivist Meetings:   Marland Kitchen Marital Status:    Family History  Problem Relation Age of Onset  . Colon polyps Mother   . Stroke Father   . Colon cancer Neg Hx   . Esophageal cancer Neg Hx   . Rectal cancer Neg Hx   . Stomach cancer Neg Hx   . Liver disease Neg Hx   . Pancreatic cancer Neg Hx   . Prostate cancer Neg Hx    No Known Allergies Prior  to Admission medications   Medication Sig Start Date End Date Taking? Authorizing Provider  BLACK ELDERBERRY,BERRY-FLOWER, PO Take by mouth daily.    Yes [provider]  Chlorphen-PE-Acetaminophen (NOREL AD) 4-10-325 MG TABS Take 1 tablet by mouth as needed.   Yes [provider]  Chlorphen-Phenyleph-ASA (ALKA-SELTZER PLUS COLD PO) Take by mouth as needed.    Yes [provider]  cholecalciferol (VITAMIN D) 1000 units tablet Take 1,000 Units by mouth daily.   Yes [provider]  cyclobenzaprine (FLEXERIL) 10 MG tablet Take 1 tablet (10 mg total) by mouth at bedtime as needed. 07/14/19  Yes Glendale Chard, MD  Dextromethorphan-guaiFENesin (CORICIDIN HBP CONGESTION/COUGH PO) Take by mouth as needed.    Yes [provider]  hydrochlorothiazide (MICROZIDE) 12.5 MG capsule TAKE 1 CAPSULE (12.5 MG TOTAL) BY MOUTH DAILY. 08/18/19  Yes Glendale Chard, MD   HYDROcodone-homatropine (HYDROMET) 5-1.5 MG/5ML syrup Take 5 mLs by mouth every 6 (six) hours as needed. 02/17/19  Yes Minette Brine, FNP  ibuprofen (ADVIL,MOTRIN) 800 MG tablet Take 800 mg by mouth every 8 (eight) hours as needed.   Yes [provider]  magnesium oxide (MAG-OX) 400 MG tablet Take 400 mg by mouth as needed.    Yes [provider]  mometasone (NASONEX) 50 MCG/ACT nasal spray Place 2 sprays into the nose daily as needed.  06/15/17  Yes [provider]  NON FORMULARY Sea moss   Yes [provider]  Omega-3 Fatty Acids (FISH OIL PO) Take 1,000 mg by mouth daily.    Yes [provider]  OVER THE COUNTER MEDICATION Apple cider vinegar 2 tsp daily   Yes [provider]  Semaglutide,0.25 or 0.5MG /DOS, (OZEMPIC, 0.25 OR 0.5 MG/DOSE,) 2 MG/1.5ML SOPN Inject 0.5 mg into the skin once a week. Patient taking differently: Inject 0.5 mg into the skin once a week. wednesday's 11/27/18  Yes Glendale Chard, MD  vitamin C (ASCORBIC ACID) 500 MG tablet Take 500 mg by mouth as needed.    Yes [provider]     Positive ROS: Otherwise negative  All other systems have been reviewed and were otherwise negative with the exception of those mentioned in the HPI and as above.  Physical Exam: Constitutional: Alert, well-appearing, no acute distress Ears: External ears without lesions or tenderness. Ear canals are clear bilaterally with intact, clear TMs bilaterally. Nasal: External nose without lesions. Clear nasal passages bilaterally with no signs of infection. Oral: Lips and gums without lesions. Tongue and palate mucosa without lesions. Posterior oropharynx clear.  She has average size symmetric tonsils bilaterally.  When palpating with the tongue blade the region of discomfort is in the area of the left tonsil.  On manual palpation of the left tonsil this is soft with no palpable abnormality noted.  Indirect laryngoscopy revealed a clear  base of tongue and inferior pole of the tonsil.  Vallecula and epiglottis were normal.  Hypopharynx was clear as was the vocal cords. Neck: No palpable adenopathy or masses.  No palpable adenopathy in the left neck. Respiratory: Breathing comfortably  Skin: No facial/neck lesions or rash noted.  Procedures  Assessment: I suspect the symptoms she is experiencing may be related to the tonsil.  But there is no evidence of active infection or neoplasm.  It is possible GE reflux disease could be contributing to discomfort.  Plan: I reassured Katlyn of normal upper airway examination with no evidence of infection or neoplasm. I suggested taking omeprazole 40 mg daily before  dinner for 4 weeks and gave her a prescription to see if this helps at all. She will follow-up if she notices any worsening of the throat symptoms.   Radene Journey, MD   CC:

## 2019-10-13 ENCOUNTER — Ambulatory Visit (INDEPENDENT_AMBULATORY_CARE_PROVIDER_SITE_OTHER): Payer: No Typology Code available for payment source | Admitting: Internal Medicine

## 2019-10-13 ENCOUNTER — Encounter: Payer: Self-pay | Admitting: Internal Medicine

## 2019-10-13 ENCOUNTER — Other Ambulatory Visit: Payer: Self-pay

## 2019-10-13 VITALS — BP 126/82 | HR 70 | Temp 98.0°F | Ht 64.6 in | Wt 189.2 lb

## 2019-10-13 DIAGNOSIS — E1165 Type 2 diabetes mellitus with hyperglycemia: Secondary | ICD-10-CM | POA: Diagnosis not present

## 2019-10-13 DIAGNOSIS — Z0000 Encounter for general adult medical examination without abnormal findings: Secondary | ICD-10-CM

## 2019-10-13 DIAGNOSIS — I10 Essential (primary) hypertension: Secondary | ICD-10-CM

## 2019-10-13 LAB — POCT URINALYSIS DIPSTICK
Bilirubin, UA: NEGATIVE
Glucose, UA: NEGATIVE
Ketones, UA: NEGATIVE
Leukocytes, UA: NEGATIVE
Nitrite, UA: NEGATIVE
Protein, UA: NEGATIVE
Spec Grav, UA: 1.02 (ref 1.010–1.025)
Urobilinogen, UA: 0.2 E.U./dL
pH, UA: 5 (ref 5.0–8.0)

## 2019-10-13 LAB — POCT UA - MICROALBUMIN
Albumin/Creatinine Ratio, Urine, POC: <30
Creatinine, POC: 50 mg/dL
Microalbumin Ur, POC: 10 mg/L

## 2019-10-13 NOTE — Patient Instructions (Signed)
Health Maintenance, Female Adopting a healthy lifestyle and getting preventive care are important in promoting health and wellness. Ask your health care provider about:  The right schedule for you to have regular tests and exams.  Things you can do on your own to prevent diseases and keep yourself healthy. What should I know about diet, weight, and exercise? Eat a healthy diet   Eat a diet that includes plenty of vegetables, fruits, low-fat dairy products, and lean protein.  Do not eat a lot of foods that are high in solid fats, added sugars, or sodium. Maintain a healthy weight Body mass index (BMI) is used to identify weight problems. It estimates body fat based on height and weight. Your health care provider can help determine your BMI and help you achieve or maintain a healthy weight. Get regular exercise Get regular exercise. This is one of the most important things you can do for your health. Most adults should:  Exercise for at least 150 minutes each week. The exercise should increase your heart rate and make you sweat (moderate-intensity exercise).  Do strengthening exercises at least twice a week. This is in addition to the moderate-intensity exercise.  Spend less time sitting. Even light physical activity can be beneficial. Watch cholesterol and blood lipids Have your blood tested for lipids and cholesterol at 53 years of age, then have this test every 5 years. Have your cholesterol levels checked more often if:  Your lipid or cholesterol levels are high.  You are older than 53 years of age.  You are at high risk for heart disease. What should I know about cancer screening? Depending on your health history and family history, you may need to have cancer screening at various ages. This may include screening for:  Breast cancer.  Cervical cancer.  Colorectal cancer.  Skin cancer.  Lung cancer. What should I know about heart disease, diabetes, and high blood  pressure? Blood pressure and heart disease  High blood pressure causes heart disease and increases the risk of stroke. This is more likely to develop in people who have high blood pressure readings, are of African descent, or are overweight.  Have your blood pressure checked: ? Every 3-5 years if you are 18-39 years of age. ? Every year if you are 40 years old or older. Diabetes Have regular diabetes screenings. This checks your fasting blood sugar level. Have the screening done:  Once every three years after age 40 if you are at a normal weight and have a low risk for diabetes.  More often and at a younger age if you are overweight or have a high risk for diabetes. What should I know about preventing infection? Hepatitis B If you have a higher risk for hepatitis B, you should be screened for this virus. Talk with your health care provider to find out if you are at risk for hepatitis B infection. Hepatitis C Testing is recommended for:  Everyone born from 1945 through 1965.  Anyone with known risk factors for hepatitis C. Sexually transmitted infections (STIs)  Get screened for STIs, including gonorrhea and chlamydia, if: ? You are sexually active and are younger than 53 years of age. ? You are older than 53 years of age and your health care provider tells you that you are at risk for this type of infection. ? Your sexual activity has changed since you were last screened, and you are at increased risk for chlamydia or gonorrhea. Ask your health care provider if   you are at risk.  Ask your health care provider about whether you are at high risk for HIV. Your health care provider may recommend a prescription medicine to help prevent HIV infection. If you choose to take medicine to prevent HIV, you should first get tested for HIV. You should then be tested every 3 months for as long as you are taking the medicine. Pregnancy  If you are about to stop having your period (premenopausal) and  you may become pregnant, seek counseling before you get pregnant.  Take 400 to 800 micrograms (mcg) of folic acid every day if you become pregnant.  Ask for birth control (contraception) if you want to prevent pregnancy. Osteoporosis and menopause Osteoporosis is a disease in which the bones lose minerals and strength with aging. This can result in bone fractures. If you are 65 years old or older, or if you are at risk for osteoporosis and fractures, ask your health care provider if you should:  Be screened for bone loss.  Take a calcium or vitamin D supplement to lower your risk of fractures.  Be given hormone replacement therapy (HRT) to treat symptoms of menopause. Follow these instructions at home: Lifestyle  Do not use any products that contain nicotine or tobacco, such as cigarettes, e-cigarettes, and chewing tobacco. If you need help quitting, ask your health care provider.  Do not use street drugs.  Do not share needles.  Ask your health care provider for help if you need support or information about quitting drugs. Alcohol use  Do not drink alcohol if: ? Your health care provider tells you not to drink. ? You are pregnant, may be pregnant, or are planning to become pregnant.  If you drink alcohol: ? Limit how much you use to 0-1 drink a day. ? Limit intake if you are breastfeeding.  Be aware of how much alcohol is in your drink. In the [place] one drink equals one 12 oz bottle of beer (355 mL), one 5 oz glass of wine (148 mL), or one 1 oz glass of hard liquor (44 mL). General instructions  Schedule regular health, dental, and eye exams.  Stay current with your vaccines.  Tell your health care provider if: ? You often feel depressed. ? You have ever been abused or do not feel safe at home. Summary  Adopting a healthy lifestyle and getting preventive care are important in promoting health and wellness.  Follow your health care provider's instructions about healthy  diet, exercising, and getting tested or screened for diseases.  Follow your health care provider's instructions on monitoring your cholesterol and blood pressure. This information is not intended to replace advice given to you by your health care provider. Make sure you discuss any questions you have with your health care provider. Document Revised: 04/17/2018 Document Reviewed: 04/17/2018 Elsevier Patient Education  2020 [employer]  

## 2019-10-13 NOTE — Progress Notes (Signed)
This visit occurred during the SARS-CoV-2 public health emergency.  Safety protocols were in place, including screening questions prior to the visit, additional usage of staff PPE, and extensive cleaning of exam room while observing appropriate contact time as indicated for disinfecting solutions.  Subjective:     Patient ID: Sandra Sims , female    DOB: 14-Apr-1966 , 54 y.o.   MRN: [id number]   Chief Complaint  Patient presents with  . Annual Exam  . Diabetes  . Hypertension    HPI  She is here today for a full physical examination.  She is followed by Dr. Garwin Brothers for her Gyn care.    Diabetes She presents for her follow-up diabetic visit. She has type 2 diabetes mellitus. Her disease course has been stable. There are no hypoglycemic associated symptoms. Pertinent negatives for diabetes include no blurred vision and no chest pain. There are no hypoglycemic complications. Risk factors for coronary artery disease include diabetes mellitus, dyslipidemia, hypertension, obesity and post-menopausal. She is compliant with treatment most of the time. She is following a diabetic diet. She participates in exercise three times a week. An ACE inhibitor/angiotensin II receptor blocker is not being taken.  Hypertension This is a chronic problem. The current episode started more than 1 year ago. The problem has been gradually improving since onset. The problem is controlled. Pertinent negatives include no blurred vision, chest pain, palpitations or shortness of breath. The current treatment provides moderate improvement. There are no compliance problems.      Past Medical History:  Diagnosis Date  . Hypertension   . PMB (postmenopausal bleeding)   . Sickle cell trait ([place])   . Thickened endometrium   . Type 2 diabetes mellitus ([place])    followed by pcp  . Uterine fibroid   . Wears glasses      Family History  Problem Relation Age of Onset  . Colon polyps Mother   . Stroke Father   .  Colon cancer Neg Hx   . Esophageal cancer Neg Hx   . Rectal cancer Neg Hx   . Stomach cancer Neg Hx   . Liver disease Neg Hx   . Pancreatic cancer Neg Hx   . Prostate cancer Neg Hx      Current Outpatient Medications:  .  BLACK ELDERBERRY,BERRY-FLOWER, PO, Take by mouth daily. , Disp: , Rfl:  .  Chlorphen-PE-Acetaminophen (NOREL AD) 4-10-325 MG TABS, Take 1 tablet by mouth as needed., Disp: , Rfl:  .  Chlorphen-Phenyleph-ASA (ALKA-SELTZER PLUS COLD PO), Take by mouth as needed. , Disp: , Rfl:  .  cholecalciferol (VITAMIN D) 1000 units tablet, Take 1,000 Units by mouth daily., Disp: , Rfl:  .  cyclobenzaprine (FLEXERIL) 10 MG tablet, Take 1 tablet (10 mg total) by mouth at bedtime as needed., Disp: 30 tablet, Rfl: 0 .  Dextromethorphan-guaiFENesin (CORICIDIN HBP CONGESTION/COUGH PO), Take by mouth as needed. , Disp: , Rfl:  .  hydrochlorothiazide (MICROZIDE) 12.5 MG capsule, TAKE 1 CAPSULE (12.5 MG TOTAL) BY MOUTH DAILY., Disp: 90 capsule, Rfl: 1 .  HYDROcodone-homatropine (HYDROMET) 5-1.5 MG/5ML syrup, Take 5 mLs by mouth every 6 (six) hours as needed., Disp: 120 mL, Rfl: 0 .  ibuprofen (ADVIL,MOTRIN) 800 MG tablet, Take 800 mg by mouth every 8 (eight) hours as needed., Disp: , Rfl:  .  magnesium oxide (MAG-OX) 400 MG tablet, Take 400 mg by mouth as needed. , Disp: , Rfl:  .  mometasone (NASONEX) 50 MCG/ACT nasal spray, Place 2 sprays into  the nose daily as needed. , Disp: , Rfl: 2 .  NON FORMULARY, Sea moss, Disp: , Rfl:  .  Omega-3 Fatty Acids (FISH OIL PO), Take 1,000 mg by mouth daily. , Disp: , Rfl:  .  omeprazole (PRILOSEC) 40 MG capsule, SMARTSIG:1 Capsule(s) By Mouth Every Evening, Disp: , Rfl:  .  OVER THE COUNTER MEDICATION, Apple cider vinegar 2 tsp daily, Disp: , Rfl:  .  Semaglutide,0.25 or 0.'5MG'$ /DOS, (OZEMPIC, 0.25 OR 0.5 MG/DOSE,) 2 MG/1.5ML SOPN, Inject 0.5 mg into the skin once a week. (Patient taking differently: Inject 0.5 mg into the skin once a week. wednesday's), Disp:  3 pen, Rfl: 1 .  vitamin C (ASCORBIC ACID) 500 MG tablet, Take 500 mg by mouth as needed. , Disp: , Rfl:    No Known Allergies    The patient states she uses post menopausal status for birth control. Last LMP was No LMP recorded (lmp unknown). Patient is postmenopausal.. Negative for Dysmenorrhea Negative for: breast discharge, breast lump(s), breast pain and breast self exam. Associated symptoms include abnormal vaginal bleeding. Pertinent negatives include abnormal bleeding (hematology), anxiety, decreased libido, depression, difficulty falling sleep, dyspareunia, history of infertility, nocturia, sexual dysfunction, sleep disturbances, urinary incontinence, urinary urgency, vaginal discharge and vaginal itching. Diet regular.The patient states her exercise level is   moderate.  . The patient's tobacco use is:  Social History   Tobacco Use  Smoking Status Former Smoker  . Packs/day: 0.50  . Years: 11.00  . Pack years: 5.50  . Quit date: 02/06/1995  . Years since quitting: 24.6  Smokeless Tobacco Never Used  . She has been exposed to passive smoke. The patient's alcohol use is:  Social History   Substance and Sexual Activity  Alcohol Use No    Review of Systems  Constitutional: Negative.   HENT: Negative.   Eyes: Negative.  Negative for blurred vision.  Respiratory: Negative.  Negative for shortness of breath.   Cardiovascular: Negative.  Negative for chest pain and palpitations.  Endocrine: Negative.   Genitourinary: Negative.   Musculoskeletal: Negative.   Skin: Negative.   Allergic/Immunologic: Negative.   Neurological: Negative.   Hematological: Negative.   Psychiatric/Behavioral: Negative.      Today's Vitals   10/13/19 1035  BP: 126/82  Pulse: 70  Temp: 98 F (36.7 C)  TempSrc: Oral  Weight: 189 lb 3.2 oz (85.8 kg)  Height: 5' 4.6" (1.641 m)   Body mass index is 31.88 kg/m.   Objective:  Physical Exam Vitals and nursing note reviewed.  Constitutional:       Appearance: Normal appearance. She is obese.  HENT:     Head: Normocephalic and atraumatic.     Right Ear: Tympanic membrane, ear canal and external ear normal.     Left Ear: Tympanic membrane, ear canal and external ear normal.     Nose:     Comments: Deferred, masked    Mouth/Throat:     Comments: Deferred, masked Eyes:     Extraocular Movements: Extraocular movements intact.     Conjunctiva/sclera: Conjunctivae normal.     Pupils: Pupils are equal, round, and reactive to light.  Cardiovascular:     Rate and Rhythm: Normal rate and regular rhythm.     Pulses: Normal pulses.          Dorsalis pedis pulses are 2+ on the right side and 2+ on the left side.       Posterior tibial pulses are 2+ on the right side and  2+ on the left side.     Heart sounds: Normal heart sounds.  Pulmonary:     Effort: Pulmonary effort is normal.     Breath sounds: Normal breath sounds.  Chest:     Breasts: Tanner Score is 5.        Right: Normal.        Left: Normal.  Abdominal:     General: Abdomen is flat. Bowel sounds are normal.     Palpations: Abdomen is soft.  Genitourinary:    Comments: deferred Musculoskeletal:        General: Normal range of motion.     Cervical back: Normal range of motion and neck supple.  Feet:     Right foot:     Protective Sensation: 5 sites tested. 5 sites sensed.     Skin integrity: Skin integrity normal.     Toenail Condition: Right toenails are normal.     Left foot:     Protective Sensation: 5 sites tested. 5 sites sensed.     Skin integrity: Skin integrity normal.     Toenail Condition: Left toenails are normal.  Skin:    General: Skin is warm and dry.  Neurological:     General: No focal deficit present.     Mental Status: She is alert and oriented to person, place, and time.  Psychiatric:        Mood and Affect: Mood normal.        Behavior: Behavior normal.         Assessment And Plan:     1. Routine general medical examination at health  care facility  A full exam was performed.  Importance of monthly self breast exams was discussed with the patient. PATIENT IS ADVISED TO GET 30-45 MINUTES REGULAR EXERCISE NO LESS THAN FOUR TO FIVE DAYS PER WEEK - BOTH WEIGHTBEARING EXERCISES AND AEROBIC ARE RECOMMENDED.  SHE IS ADVISED TO FOLLOW A HEALTHY DIET WITH AT LEAST SIX FRUITS/VEGGIES PER DAY, DECREASE INTAKE OF RED MEAT, AND TO INCREASE FISH INTAKE TO TWO DAYS PER WEEK.  MEATS/FISH SHOULD NOT BE FRIED, BAKED OR BROILED IS PREFERABLE.  I SUGGEST WEARING SPF 50 SUNSCREEN ON EXPOSED PARTS AND ESPECIALLY WHEN IN THE DIRECT SUNLIGHT FOR AN EXTENDED PERIOD OF TIME.  PLEASE AVOID FAST FOOD RESTAURANTS AND INCREASE YOUR WATER INTAKE.  - CMP14+EGFR - CBC - Lipid panel - Hemoglobin A1c  2. Uncontrolled type 2 diabetes mellitus with hyperglycemia (HCC)  Diabetic foot exam was performed.  She will rto in 4 months for re-evaluation.  I DISCUSSED WITH THE PATIENT AT LENGTH REGARDING THE GOALS OF GLYCEMIC CONTROL AND POSSIBLE LONG-TERM COMPLICATIONS.  I  ALSO STRESSED THE IMPORTANCE OF COMPLIANCE WITH HOME GLUCOSE MONITORING, DIETARY RESTRICTIONS INCLUDING AVOIDANCE OF SUGARY DRINKS/PROCESSED FOODS,  ALONG WITH REGULAR EXERCISE.  I  ALSO STRESSED THE IMPORTANCE OF ANNUAL EYE EXAMS, SELF FOOT CARE AND COMPLIANCE WITH OFFICE VISITS.  - POCT Urinalysis Dipstick (81002) - POCT UA - Microalbumin  3. Essential hypertension, benign  Chronic, well controlled. She will continue with current meds. EKG performed, NSR w/o acute changes. She is encouraged to avoid adding salt to her foods.   - EKG 12-Lead   [name], MD    THE PATIENT IS ENCOURAGED TO PRACTICE SOCIAL DISTANCING DUE TO THE COVID-19 PANDEMIC.

## 2019-10-14 LAB — CMP14+EGFR
ALT: 15 IU/L (ref 0–32)
AST: 14 IU/L (ref 0–40)
Albumin/Globulin Ratio: 1.3 (ref 1.2–2.2)
Albumin: 4.3 g/dL (ref 3.8–4.9)
Alkaline Phosphatase: 87 IU/L (ref 48–121)
BUN/Creatinine Ratio: 16 (ref 9–23)
BUN: 11 mg/dL (ref 6–24)
Bilirubin Total: 0.4 mg/dL (ref 0.0–1.2)
CO2: 23 mmol/L (ref 20–29)
Calcium: 9.8 mg/dL (ref 8.7–10.2)
Chloride: 102 mmol/L (ref 96–106)
Creatinine, Ser: 0.68 mg/dL (ref 0.57–1.00)
GFR calc Af Amer: 115 mL/min/{1.73_m2} (ref >59)
GFR calc non Af Amer: 100 mL/min/{1.73_m2} (ref >59)
Globulin, Total: 3.2 g/dL (ref 1.5–4.5)
Glucose: 85 mg/dL (ref 65–99)
Potassium: 3.8 mmol/L (ref 3.5–5.2)
Sodium: 143 mmol/L (ref 134–144)
Total Protein: 7.5 g/dL (ref 6.0–8.5)

## 2019-10-14 LAB — CBC
Hematocrit: 38.4 % (ref 34.0–46.6)
Hemoglobin: 12.8 g/dL (ref 11.1–15.9)
MCH: 25.7 pg — ABNORMAL LOW (ref 26.6–33.0)
MCHC: 33.3 g/dL (ref 31.5–35.7)
MCV: 77 fL — ABNORMAL LOW (ref 79–97)
Platelets: 317 10*3/uL (ref 150–450)
RBC: 4.98 x10E6/uL (ref 3.77–5.28)
RDW: 14.4 % (ref 11.7–15.4)
WBC: 6.3 10*3/uL (ref 3.4–10.8)

## 2019-10-14 LAB — LIPID PANEL
Chol/HDL Ratio: 3.7 ratio (ref 0.0–4.4)
Cholesterol, Total: 231 mg/dL — ABNORMAL HIGH (ref 100–199)
HDL: 63 mg/dL (ref >39)
LDL Chol Calc (NIH): 151 mg/dL — ABNORMAL HIGH (ref 0–99)
Triglycerides: 97 mg/dL (ref 0–149)
VLDL Cholesterol Cal: 17 mg/dL (ref 5–40)

## 2019-10-14 LAB — HEMOGLOBIN A1C
Est. average glucose Bld gHb Est-mCnc: 128 mg/dL
Hgb A1c MFr Bld: 6.1 % — ABNORMAL HIGH (ref 4.8–5.6)

## 2019-10-16 ENCOUNTER — Telehealth: Payer: Self-pay

## 2019-10-16 ENCOUNTER — Other Ambulatory Visit: Payer: Self-pay

## 2019-10-16 NOTE — Telephone Encounter (Signed)
Response for ozempic prior states that the pt doesn't need a prior auth at required for this strength and quantity.

## 2019-10-16 NOTE — Telephone Encounter (Signed)
Ozempic prior auth completed waiting on response from the insurance.Sandra Sims

## 2019-11-19 ENCOUNTER — Other Ambulatory Visit: Payer: Self-pay | Admitting: Internal Medicine

## 2019-11-19 DIAGNOSIS — Z1231 Encounter for screening mammogram for malignant neoplasm of breast: Secondary | ICD-10-CM

## 2019-12-05 ENCOUNTER — Other Ambulatory Visit: Payer: Self-pay

## 2019-12-05 ENCOUNTER — Ambulatory Visit
Admission: RE | Admit: 2019-12-05 | Discharge: 2019-12-05 | Disposition: A | Discharge status: Home / Self Care | Payer: No Typology Code available for payment source | Source: Ambulatory Visit | Attending: Internal Medicine | Admitting: Internal Medicine

## 2019-12-05 DIAGNOSIS — Z1231 Encounter for screening mammogram for malignant neoplasm of breast: Secondary | ICD-10-CM

## 2019-12-26 ENCOUNTER — Ambulatory Visit (INDEPENDENT_AMBULATORY_CARE_PROVIDER_SITE_OTHER): Payer: No Typology Code available for payment source

## 2019-12-26 ENCOUNTER — Other Ambulatory Visit: Payer: Self-pay

## 2019-12-26 DIAGNOSIS — Z23 Encounter for immunization: Secondary | ICD-10-CM | POA: Diagnosis not present

## 2019-12-26 NOTE — Progress Notes (Signed)
   Covid-19 Vaccination Clinic  Name:  Sandra Sims    MRN: [id number] DOB: December 10, 1965  12/26/2019  [name] was observed post Covid-19 immunization for 15 minutes without incident. She was provided with Vaccine Information Sheet and instruction to access the V-Safe system.   [name] was instructed to call 911 with any severe reactions post vaccine: Marland Kitchen Difficulty breathing  . Swelling of face and throat  . A fast heartbeat  . A bad rash all over body  . Dizziness and weakness   Immunizations Administered    Name Date Dose VIS Date Route   Moderna COVID-19 Vaccine 12/26/2019  1:39 PM 0.5 mL 04/2019 Intramuscular   Manufacturer: Levan Hurst   Lot: [id number]   [place]: [id number]

## 2020-01-14 MED FILL — HYDROCHLOROTHIAZIDE 12.5 MG: 12.5 | 90 days supply | Qty: 90 | Fill #1

## 2020-01-23 ENCOUNTER — Ambulatory Visit (INDEPENDENT_AMBULATORY_CARE_PROVIDER_SITE_OTHER): Payer: No Typology Code available for payment source

## 2020-01-23 DIAGNOSIS — Z23 Encounter for immunization: Secondary | ICD-10-CM

## 2020-01-23 NOTE — Progress Notes (Signed)
   Covid-19 Vaccination Clinic  Name:  REIZY DUNLOW    MRN: [id number] DOB: 1965/09/13  01/23/2020  Ms. Wisby was observed post Covid-19 immunization for 15 minutes without incident. She was provided with Vaccine Information Sheet and instruction to access the V-Safe system.   Ms. Ruark was instructed to call 911 with any severe reactions post vaccine: Marland Kitchen Difficulty breathing  . Swelling of face and throat  . A fast heartbeat  . A bad rash all over body  . Dizziness and weakness   Immunizations Administered    Name Date Dose VIS Date Route   Moderna COVID-19 Vaccine 01/23/2020  8:26 AM 0.5 mL 04/2019 Intramuscular   Manufacturer: Moderna   Lot: [id number]   [place]: [id number]

## 2020-01-28 ENCOUNTER — Telehealth: Payer: Self-pay

## 2020-01-28 NOTE — Telephone Encounter (Signed)
The pt was notified that she needed to come and get a covid vaccination on Friday because she got 0.1 mg and not 0.5 mg dose of moderna

## 2020-01-30 ENCOUNTER — Telehealth: Payer: Self-pay

## 2020-01-30 ENCOUNTER — Other Ambulatory Visit: Payer: Self-pay

## 2020-01-30 ENCOUNTER — Ambulatory Visit (INDEPENDENT_AMBULATORY_CARE_PROVIDER_SITE_OTHER): Payer: No Typology Code available for payment source

## 2020-01-30 DIAGNOSIS — Z23 Encounter for immunization: Secondary | ICD-10-CM | POA: Diagnosis not present

## 2020-01-30 NOTE — Progress Notes (Signed)
   Covid-19 Vaccination Clinic  Name:  Sandra Sims    MRN: [id number] DOB: 1965-05-17  01/30/2020  Sandra Sims was observed post Covid-19 immunization for 15 minutes without incident. She was provided with Vaccine Information Sheet and instruction to access the V-Safe system.   Sandra Sims was instructed to call 911 with any severe reactions post vaccine: Marland Kitchen Difficulty breathing  . Swelling of face and throat  . A fast heartbeat  . A bad rash all over body  . Dizziness and weakness   Immunizations Administered    Name Date Dose VIS Date Route   Moderna COVID-19 Vaccine 01/30/2020  9:21 AM 0.5 mL 04/2019 Intramuscular   Manufacturer: Moderna   Lot: [id number]   [place]: [phone]

## 2020-01-30 NOTE — Telephone Encounter (Signed)
Using pt chart to Updated pt COVID Vaccine information

## 2020-02-12 ENCOUNTER — Encounter: Payer: Self-pay | Admitting: Internal Medicine

## 2020-02-12 ENCOUNTER — Ambulatory Visit (INDEPENDENT_AMBULATORY_CARE_PROVIDER_SITE_OTHER): Payer: No Typology Code available for payment source | Admitting: Internal Medicine

## 2020-02-12 ENCOUNTER — Other Ambulatory Visit: Payer: Self-pay

## 2020-02-12 VITALS — BP 114/72 | HR 91 | Temp 98.0°F | Ht 64.6 in | Wt 183.0 lb

## 2020-02-12 DIAGNOSIS — I10 Essential (primary) hypertension: Secondary | ICD-10-CM | POA: Diagnosis not present

## 2020-02-12 DIAGNOSIS — D573 Sickle-cell trait: Secondary | ICD-10-CM

## 2020-02-12 DIAGNOSIS — Z1159 Encounter for screening for other viral diseases: Secondary | ICD-10-CM | POA: Diagnosis not present

## 2020-02-12 DIAGNOSIS — Z79899 Other long term (current) drug therapy: Secondary | ICD-10-CM

## 2020-02-12 DIAGNOSIS — E1165 Type 2 diabetes mellitus with hyperglycemia: Secondary | ICD-10-CM

## 2020-02-12 NOTE — Patient Instructions (Signed)
Diabetes Mellitus and Exercise Exercising regularly is important for your overall health, especially when you have diabetes (diabetes mellitus). Exercising is not only about losing weight. It has many other health benefits, such as increasing muscle strength and bone density and reducing body fat and stress. This leads to improved fitness, flexibility, and endurance, all of which result in better overall health. Exercise has additional benefits for people with diabetes, including:  Reducing appetite.  Helping to lower and control blood glucose.  Lowering blood pressure.  Helping to control amounts of fatty substances (lipids) in the blood, such as cholesterol and triglycerides.  Helping the body to respond better to insulin (improving insulin sensitivity).  Reducing how much insulin the body needs.  Decreasing the risk for heart disease by: ? Lowering cholesterol and triglyceride levels. ? Increasing the levels of good cholesterol. ? Lowering blood glucose levels. What is my activity plan? Your health care provider or certified diabetes educator can help you make a plan for the type and frequency of exercise (activity plan) that works for you. Make sure that you:  Do at least 150 minutes of moderate-intensity or vigorous-intensity exercise each week. This could be brisk walking, biking, or water aerobics. ? Do stretching and strength exercises, such as yoga or weightlifting, at least 2 times a week. ? Spread out your activity over at least 3 days of the week.  Get some form of physical activity every day. ? Do not go more than 2 days in a row without some kind of physical activity. ? Avoid being inactive for more than 30 minutes at a time. Take frequent breaks to walk or stretch.  Choose a type of exercise or activity that you enjoy, and set realistic goals.  Start slowly, and gradually increase the intensity of your exercise over time. What do I need to know about managing my  diabetes?   Check your blood glucose before and after exercising. ? If your blood glucose is 240 mg/dL (13.3 mmol/L) or higher before you exercise, check your urine for ketones. If you have ketones in your urine, do not exercise until your blood glucose returns to normal. ? If your blood glucose is 100 mg/dL (5.6 mmol/L) or lower, eat a snack containing 15-20 grams of carbohydrate. Check your blood glucose 15 minutes after the snack to make sure that your level is above 100 mg/dL (5.6 mmol/L) before you start your exercise.  Know the symptoms of low blood glucose (hypoglycemia) and how to treat it. Your risk for hypoglycemia increases during and after exercise. Common symptoms of hypoglycemia can include: ? Hunger. ? Anxiety. ? Sweating and feeling clammy. ? Confusion. ? Dizziness or feeling light-headed. ? Increased heart rate or palpitations. ? Blurry vision. ? Tingling or numbness around the mouth, lips, or tongue. ? Tremors or shakes. ? Irritability.  Keep a rapid-acting carbohydrate snack available before, during, and after exercise to help prevent or treat hypoglycemia.  Avoid injecting insulin into areas of the body that are going to be exercised. For example, avoid injecting insulin into: ? The arms, when playing tennis. ? The legs, when jogging.  Keep records of your exercise habits. Doing this can help you and your health care provider adjust your diabetes management plan as needed. Write down: ? Food that you eat before and after you exercise. ? Blood glucose levels before and after you exercise. ? The type and amount of exercise you have done. ? When your insulin is expected to peak, if you use   insulin. Avoid exercising at times when your insulin is peaking.  When you start a new exercise or activity, work with your health care provider to make sure the activity is safe for you, and to adjust your insulin, medicines, or food intake as needed.  Drink plenty of water while  you exercise to prevent dehydration or heat stroke. Drink enough fluid to keep your urine clear or pale yellow. Summary  Exercising regularly is important for your overall health, especially when you have diabetes (diabetes mellitus).  Exercising has many health benefits, such as increasing muscle strength and bone density and reducing body fat and stress.  Your health care provider or certified diabetes educator can help you make a plan for the type and frequency of exercise (activity plan) that works for you.  When you start a new exercise or activity, work with your health care provider to make sure the activity is safe for you, and to adjust your insulin, medicines, or food intake as needed. This information is not intended to replace advice given to you by your health care provider. Make sure you discuss any questions you have with your health care provider. Document Revised: 11/16/2016 Document Reviewed: 10/04/2015 Elsevier Patient Education  2020 Elsevier Inc.  

## 2020-02-12 NOTE — Progress Notes (Signed)
Rutherford Nail as a scribe for [name], MD.,have documented all relevant documentation on the behalf of [name], MD,as directed by  [name], MD while in the presence of [name], MD. This visit occurred during the SARS-CoV-2 public health emergency.  Safety protocols were in place, including screening questions prior to the visit, additional usage of staff PPE, and extensive cleaning of exam room while observing appropriate contact time as indicated for disinfecting solutions.  Subjective:     Patient ID: Sandra Sims , female    DOB: 29-Nov-1965 , 54 y.o.   MRN: [id number]   Chief Complaint  Patient presents with  . Diabetes  . Hypertension    HPI  Diabetes She presents for her follow-up diabetic visit. She has type 2 diabetes mellitus. Her disease course has been stable. There are no hypoglycemic associated symptoms. Pertinent negatives for diabetes include no blurred vision and no chest pain. There are no hypoglycemic complications. Risk factors for coronary artery disease include diabetes mellitus, dyslipidemia, hypertension, obesity and post-menopausal. She is compliant with treatment most of the time. She is following a diabetic diet. She participates in exercise three times a week. An ACE inhibitor/angiotensin II receptor blocker is not being taken.  Hypertension This is a chronic problem. The current episode started more than 1 year ago. The problem has been gradually improving since onset. The problem is controlled. Pertinent negatives include no blurred vision, chest pain, palpitations or shortness of breath. The current treatment provides moderate improvement. There are no compliance problems.      Past Medical History:  Diagnosis Date  . Hypertension   . PMB (postmenopausal bleeding)   . Sickle cell trait ([place])   . Thickened endometrium   . Type 2 diabetes mellitus ([place])    followed by pcp  . Uterine fibroid   . Wears glasses       Family History  Problem Relation Age of Onset  . Colon polyps Mother   . Stroke Father   . Colon cancer Neg Hx   . Esophageal cancer Neg Hx   . Rectal cancer Neg Hx   . Stomach cancer Neg Hx   . Liver disease Neg Hx   . Pancreatic cancer Neg Hx   . Prostate cancer Neg Hx      Current Outpatient Medications:  .  BLACK ELDERBERRY,BERRY-FLOWER, PO, Take by mouth daily. , Disp: , Rfl:  .  Chlorphen-PE-Acetaminophen (NOREL AD) 4-10-325 MG TABS, Take 1 tablet by mouth as needed., Disp: , Rfl:  .  Chlorphen-Phenyleph-ASA (ALKA-SELTZER PLUS COLD PO), Take by mouth as needed. , Disp: , Rfl:  .  cholecalciferol (VITAMIN D) 1000 units tablet, Take 1,000 Units by mouth daily., Disp: , Rfl:  .  cyclobenzaprine (FLEXERIL) 10 MG tablet, Take 1 tablet (10 mg total) by mouth at bedtime as needed., Disp: 30 tablet, Rfl: 0 .  Dextromethorphan-guaiFENesin (CORICIDIN HBP CONGESTION/COUGH PO), Take by mouth as needed. , Disp: , Rfl:  .  hydrochlorothiazide (MICROZIDE) 12.5 MG capsule, TAKE 1 CAPSULE (12.5 MG TOTAL) BY MOUTH DAILY., Disp: 90 capsule, Rfl: 1 .  HYDROcodone-homatropine (HYDROMET) 5-1.5 MG/5ML syrup, Take 5 mLs by mouth every 6 (six) hours as needed., Disp: 120 mL, Rfl: 0 .  ibuprofen (ADVIL,MOTRIN) 800 MG tablet, Take 800 mg by mouth every 8 (eight) hours as needed., Disp: , Rfl:  .  magnesium oxide (MAG-OX) 400 MG tablet, Take 400 mg by mouth as needed. , Disp: , Rfl:  .  mometasone (NASONEX) 50 MCG/ACT nasal spray, Place 2 sprays into the nose daily as needed. , Disp: , Rfl: 2 .  NON FORMULARY, Sea moss, Disp: , Rfl:  .  Omega-3 Fatty Acids (FISH OIL PO), Take 1,000 mg by mouth daily. , Disp: , Rfl:  .  omeprazole (PRILOSEC) 40 MG capsule, SMARTSIG:1 Capsule(s) By Mouth Every Evening, Disp: , Rfl:  .  OVER THE COUNTER MEDICATION, Apple cider vinegar 2 tsp daily, Disp: , Rfl:  .  Semaglutide,0.25 or 0.5MG /DOS, (OZEMPIC, 0.25 OR 0.5 MG/DOSE,) 2 MG/1.5ML SOPN, Inject 0.5 mg into the skin  once a week. (Patient taking differently: Inject 0.5 mg into the skin once a week. wednesday's), Disp: 3 pen, Rfl: 1 .  vitamin C (ASCORBIC ACID) 500 MG tablet, Take 500 mg by mouth as needed. , Disp: , Rfl:    No Known Allergies   Review of Systems  Constitutional: Negative.   HENT: Negative.   Eyes: Negative for blurred vision.  Respiratory: Negative.  Negative for shortness of breath.   Cardiovascular: Negative for chest pain and palpitations.  Musculoskeletal: Negative.   Skin: Negative.   Neurological: Negative.   Hematological: Negative.   Psychiatric/Behavioral: Negative.      Today's Vitals   02/12/20 1223  BP: 114/72  Pulse: 91  Temp: 98 F (36.7 C)  TempSrc: Oral  Weight: 183 lb (83 kg)  Height: 5' 4.6" (1.641 m)  PainSc: 0-No pain   Body mass index is 30.83 kg/m.   Objective:  Physical Exam Vitals and nursing note reviewed.  Constitutional:      Appearance: Normal appearance.  HENT:     Head: Normocephalic and atraumatic.  Cardiovascular:     Rate and Rhythm: Normal rate and regular rhythm.     Heart sounds: Normal heart sounds.  Pulmonary:     Effort: Pulmonary effort is normal.     Breath sounds: Normal breath sounds.  Skin:    General: Skin is warm.  Neurological:     General: No focal deficit present.     Mental Status: She is alert.  Psychiatric:        Mood and Affect: Mood normal.        Behavior: Behavior normal.         Assessment And Plan:     1. Uncontrolled type 2 diabetes mellitus with hyperglycemia (HCC) Comments: Chronic, I will check labs as listed below. She is encouraged to follow dietary guidelines.  I will check lipid status as well. She will rto in 4 months.  - BMP8+EGFR - Hemoglobin A1c - Lipid panel  2. Essential hypertension, benign Comments: Chronic, well controlled. She will continue with current meds.   3. Sickle cell trait (HCC) Comments: Chronic, yet stable.she is encouraged to stay well hydrated.   4.  Encounter for HCV screening test for low risk patient Comments: I will check a hepatitis c antibody.  - Hepatitis C antibody  5. Drug therapy - Vitamin B12   Wt Readings from Last 3 Encounters:  02/12/20 183 lb (83 kg)  10/13/19 189 lb 3.2 oz (85.8 kg)  08/19/19 189 lb 12.8 oz (86.1 kg)    Patient was given opportunity to ask questions. Patient verbalized understanding of the plan and was able to repeat key elements of the plan. All questions were answered to their satisfaction.  [name], MD   I, [name], MD, have reviewed all documentation for this visit. The documentation on 02/14/20 for the exam, diagnosis,  procedures, and orders are all accurate and complete.  THE PATIENT IS ENCOURAGED TO PRACTICE SOCIAL DISTANCING DUE TO THE COVID-19 PANDEMIC.

## 2020-02-13 LAB — LIPID PANEL
Chol/HDL Ratio: 4.3 ratio (ref 0.0–4.4)
Cholesterol, Total: 230 mg/dL — ABNORMAL HIGH (ref 100–199)
HDL: 54 mg/dL (ref >39)
LDL Chol Calc (NIH): 145 mg/dL — ABNORMAL HIGH (ref 0–99)
Triglycerides: 171 mg/dL — ABNORMAL HIGH (ref 0–149)
VLDL Cholesterol Cal: 31 mg/dL (ref 5–40)

## 2020-02-13 LAB — HEMOGLOBIN A1C
Est. average glucose Bld gHb Est-mCnc: 134 mg/dL
Hgb A1c MFr Bld: 6.3 % — ABNORMAL HIGH (ref 4.8–5.6)

## 2020-02-13 LAB — BMP8+EGFR
BUN/Creatinine Ratio: 9 (ref 9–23)
BUN: 8 mg/dL (ref 6–24)
CO2: 29 mmol/L (ref 20–29)
Calcium: 9.8 mg/dL (ref 8.7–10.2)
Chloride: 98 mmol/L (ref 96–106)
Creatinine, Ser: 0.85 mg/dL (ref 0.57–1.00)
GFR calc Af Amer: 90 mL/min/{1.73_m2} (ref >59)
GFR calc non Af Amer: 78 mL/min/{1.73_m2} (ref >59)
Glucose: 88 mg/dL (ref 65–99)
Potassium: 3.6 mmol/L (ref 3.5–5.2)
Sodium: 143 mmol/L (ref 134–144)

## 2020-02-13 LAB — VITAMIN B12: Vitamin B-12: 632 pg/mL (ref 232–1245)

## 2020-02-13 LAB — HEPATITIS C ANTIBODY: Hep C Virus Ab: <0.1 s/co ratio (ref 0.0–0.9)

## 2020-03-05 ENCOUNTER — Ambulatory Visit: Payer: No Typology Code available for payment source

## 2020-04-15 LAB — HM DIABETES EYE EXAM

## 2020-05-05 ENCOUNTER — Other Ambulatory Visit: Payer: Self-pay

## 2020-05-05 DIAGNOSIS — Z20822 Contact with and (suspected) exposure to covid-19: Secondary | ICD-10-CM

## 2020-05-07 LAB — NOVEL CORONAVIRUS, NAA: SARS-CoV-2, NAA: NOT DETECTED

## 2020-05-07 LAB — SARS-COV-2, NAA 2 DAY TAT

## 2020-05-10 ENCOUNTER — Other Ambulatory Visit: Payer: Self-pay | Admitting: Internal Medicine

## 2020-05-10 MED ORDER — AZITHROMYCIN 250 MG PO TABS: ORAL_TABLET | ORAL | 0 refills | Status: AC

## 2020-05-10 MED ORDER — HYDROCOD POLST-CPM POLST ER 10-8 MG/5ML PO SUER
5.0000 mL | Freq: Two times a day (BID) | ORAL | 0 refills | Status: DC | PRN
Start: 2020-05-10 — End: 2021-05-20

## 2020-06-15 ENCOUNTER — Other Ambulatory Visit: Payer: Self-pay | Admitting: Internal Medicine

## 2020-06-15 MED ORDER — HYDROCHLOROTHIAZIDE 12.5 MG PO CAPS
12.5000 mg | ORAL_CAPSULE | Freq: Every day | ORAL | 1 refills | Status: DC
Start: 2020-06-15 — End: 2020-06-15

## 2020-06-15 MED FILL — HYDROCHLOROTHIAZIDE 12.5 MG: 12.5 | 90 days supply | Qty: 90 | Fill #0

## 2020-06-16 ENCOUNTER — Other Ambulatory Visit: Payer: Self-pay | Admitting: Internal Medicine

## 2020-06-23 ENCOUNTER — Encounter: Payer: Self-pay | Admitting: Internal Medicine

## 2020-07-22 LAB — HM PAP SMEAR

## 2020-09-16 ENCOUNTER — Other Ambulatory Visit: Payer: Self-pay | Admitting: Internal Medicine

## 2020-09-16 DIAGNOSIS — Z1231 Encounter for screening mammogram for malignant neoplasm of breast: Secondary | ICD-10-CM

## 2020-10-12 ENCOUNTER — Other Ambulatory Visit: Payer: No Typology Code available for payment source

## 2020-10-12 DIAGNOSIS — Z0000 Encounter for general adult medical examination without abnormal findings: Secondary | ICD-10-CM

## 2020-10-13 ENCOUNTER — Encounter: Payer: Self-pay | Admitting: Internal Medicine

## 2020-10-13 ENCOUNTER — Other Ambulatory Visit (HOSPITAL_COMMUNITY): Payer: Self-pay

## 2020-10-13 ENCOUNTER — Other Ambulatory Visit: Payer: Self-pay

## 2020-10-13 ENCOUNTER — Ambulatory Visit (INDEPENDENT_AMBULATORY_CARE_PROVIDER_SITE_OTHER): Payer: No Typology Code available for payment source | Admitting: Internal Medicine

## 2020-10-13 VITALS — BP 122/86 | HR 70 | Temp 97.7°F | Ht 64.6 in | Wt 178.6 lb

## 2020-10-13 DIAGNOSIS — E1165 Type 2 diabetes mellitus with hyperglycemia: Secondary | ICD-10-CM

## 2020-10-13 DIAGNOSIS — Z6831 Body mass index (BMI) 31.0-31.9, adult: Secondary | ICD-10-CM

## 2020-10-13 DIAGNOSIS — I10 Essential (primary) hypertension: Secondary | ICD-10-CM | POA: Diagnosis not present

## 2020-10-13 DIAGNOSIS — Z0000 Encounter for general adult medical examination without abnormal findings: Secondary | ICD-10-CM | POA: Diagnosis not present

## 2020-10-13 DIAGNOSIS — E7800 Pure hypercholesterolemia, unspecified: Secondary | ICD-10-CM | POA: Diagnosis not present

## 2020-10-13 DIAGNOSIS — H0266 Xanthelasma of left eye, unspecified eyelid: Secondary | ICD-10-CM

## 2020-10-13 DIAGNOSIS — E6609 Other obesity due to excess calories: Secondary | ICD-10-CM

## 2020-10-13 LAB — CMP14+EGFR
ALT: 19 IU/L (ref 0–32)
AST: 18 IU/L (ref 0–40)
Albumin/Globulin Ratio: 1.5 (ref 1.2–2.2)
Albumin: 4.3 g/dL (ref 3.8–4.9)
Alkaline Phosphatase: 76 IU/L (ref 44–121)
BUN/Creatinine Ratio: 9 (ref 9–23)
BUN: 7 mg/dL (ref 6–24)
Bilirubin Total: 0.6 mg/dL (ref 0.0–1.2)
CO2: 27 mmol/L (ref 20–29)
Calcium: 9.7 mg/dL (ref 8.7–10.2)
Chloride: 101 mmol/L (ref 96–106)
Creatinine, Ser: 0.76 mg/dL (ref 0.57–1.00)
Globulin, Total: 2.8 g/dL (ref 1.5–4.5)
Glucose: 125 mg/dL — ABNORMAL HIGH (ref 65–99)
Potassium: 3.9 mmol/L (ref 3.5–5.2)
Sodium: 141 mmol/L (ref 134–144)
Total Protein: 7.1 g/dL (ref 6.0–8.5)
eGFR: 93 mL/min/{1.73_m2} (ref >59)

## 2020-10-13 LAB — POCT URINALYSIS DIPSTICK
Bilirubin, UA: NEGATIVE
Blood, UA: NEGATIVE
Glucose, UA: NEGATIVE
Ketones, UA: NEGATIVE
Leukocytes, UA: NEGATIVE
Nitrite, UA: NEGATIVE
Protein, UA: NEGATIVE
Spec Grav, UA: 1.02 (ref 1.010–1.025)
Urobilinogen, UA: 0.2 E.U./dL
pH, UA: 7 (ref 5.0–8.0)

## 2020-10-13 LAB — HEMOGLOBIN A1C
Est. average glucose Bld gHb Est-mCnc: 134 mg/dL
Hgb A1c MFr Bld: 6.3 % — ABNORMAL HIGH (ref 4.8–5.6)

## 2020-10-13 LAB — CBC
Hematocrit: 40 % (ref 34.0–46.6)
Hemoglobin: 13.3 g/dL (ref 11.1–15.9)
MCH: 25.7 pg — ABNORMAL LOW (ref 26.6–33.0)
MCHC: 33.3 g/dL (ref 31.5–35.7)
MCV: 77 fL — ABNORMAL LOW (ref 79–97)
Platelets: 280 10*3/uL (ref 150–450)
RBC: 5.18 x10E6/uL (ref 3.77–5.28)
RDW: 14.2 % (ref 11.7–15.4)
WBC: 3.7 10*3/uL (ref 3.4–10.8)

## 2020-10-13 LAB — LIPID PANEL
Chol/HDL Ratio: 3.5 ratio (ref 0.0–4.4)
Cholesterol, Total: 210 mg/dL — ABNORMAL HIGH (ref 100–199)
HDL: 60 mg/dL (ref >39)
LDL Chol Calc (NIH): 131 mg/dL — ABNORMAL HIGH (ref 0–99)
Triglycerides: 108 mg/dL (ref 0–149)
VLDL Cholesterol Cal: 19 mg/dL (ref 5–40)

## 2020-10-13 LAB — POCT UA - MICROALBUMIN
Albumin/Creatinine Ratio, Urine, POC: <30
Creatinine, POC: 100 mg/dL
Microalbumin Ur, POC: 10 mg/L

## 2020-10-13 MED ORDER — HYDROCHLOROTHIAZIDE 12.5 MG PO CAPS
12.5000 mg | ORAL_CAPSULE | Freq: Every day | ORAL | 1 refills | Status: DC
  Filled 2020-10-13: qty 90, 90d supply, fill #0
  Filled 2021-03-10: qty 90, 90d supply, fill #1

## 2020-10-13 NOTE — Progress Notes (Signed)
I,Katawbba Wiggins,acting as a Education administrator for [name], MD.,have documented all relevant documentation on the behalf of [name], MD,as directed by  [name], MD while in the presence of [name], MD.  This visit occurred during the SARS-CoV-2 public health emergency.  Safety protocols were in place, including screening questions prior to the visit, additional usage of staff PPE, and extensive cleaning of exam room while observing appropriate contact time as indicated for disinfecting solutions.  Subjective:     Patient ID: Sandra Sims , female    DOB: [birth year] , 55 y.o.   MRN: [id number]   Chief Complaint  Patient presents with   Annual Exam   Diabetes   Hypertension    HPI  She is here today for a full physical examination.  She is followed by Dr. Garwin Brothers for her Gyn care.  She reports compliance with meds. She denies headaches, chest pain and shortness of breath.   Diabetes She presents for her follow-up diabetic visit. She has type 2 diabetes mellitus. Her disease course has been stable. There are no hypoglycemic associated symptoms. Pertinent negatives for diabetes include no blurred vision and no chest pain. There are no hypoglycemic complications. Risk factors for coronary artery disease include diabetes mellitus, dyslipidemia, hypertension, obesity and post-menopausal. She is compliant with treatment most of the time. She is following a diabetic diet. She participates in exercise three times a week. An ACE inhibitor/angiotensin II receptor blocker is not being taken.  Hypertension This is a chronic problem. The current episode started Sims than 1 year ago. The problem has been gradually improving since onset. The problem is controlled. Pertinent negatives include no blurred vision, chest pain, palpitations or shortness of breath. The current treatment provides moderate improvement. There are no compliance problems.     Past Medical History:  Diagnosis  Date   Hypertension    PMB (postmenopausal bleeding)    Sickle cell trait (HCC)    Thickened endometrium    Type 2 diabetes mellitus ([place])    followed by pcp   Uterine fibroid    Wears glasses      Family History  Problem Relation Age of Onset   Colon polyps Mother    Stroke Father    Colon cancer Neg Hx    Esophageal cancer Neg Hx    Rectal cancer Neg Hx    Stomach cancer Neg Hx    Liver disease Neg Hx    Pancreatic cancer Neg Hx    Prostate cancer Neg Hx      Current Outpatient Medications:    BLACK ELDERBERRY,BERRY-FLOWER, PO, Take by mouth daily. , Disp: , Rfl:    chlorpheniramine-HYDROcodone (TUSSIONEX PENNKINETIC ER) 10-8 MG/5ML SUER, Take 5 mLs by mouth every 12 (twelve) hours as needed for cough., Disp: 140 mL, Rfl: 0   cholecalciferol (VITAMIN D) 1000 units tablet, Take 1,000 Units by mouth daily., Disp: , Rfl:    cyclobenzaprine (FLEXERIL) 10 MG tablet, Take 1 tablet (10 mg total) by mouth at bedtime as needed., Disp: 30 tablet, Rfl: 0   ibuprofen (ADVIL,MOTRIN) 800 MG tablet, Take 800 mg by mouth every 8 (eight) hours as needed., Disp: , Rfl:    magnesium oxide (MAG-OX) 400 MG tablet, Take 400 mg by mouth as needed. , Disp: , Rfl:    mometasone (NASONEX) 50 MCG/ACT nasal spray, Place 2 sprays into the nose daily as needed. , Disp: , Rfl: 2   NON FORMULARY, Sea moss, Disp: ,  Rfl:    Omega-3 Fatty Acids (FISH OIL PO), Take 1,000 mg by mouth daily. , Disp: , Rfl:    omeprazole (PRILOSEC) 40 MG capsule, SMARTSIG:1 Capsule(s) By Mouth Every Evening, Disp: , Rfl:    OVER THE COUNTER MEDICATION, Apple cider vinegar 2 tsp daily, Disp: , Rfl:    Semaglutide,0.25 or 0.5MG /DOS, (OZEMPIC, 0.25 OR 0.5 MG/DOSE,) 2 MG/1.5ML SOPN, Inject 0.5 mg into the skin once a week. (Patient taking differently: Inject 0.5 mg into the skin once a week. wednesday's), Disp: 3 pen, Rfl: 1   vitamin C (ASCORBIC ACID) 500 MG tablet, Take 500 mg by mouth as needed. , Disp: , Rfl:     hydrochlorothiazide (MICROZIDE) 12.5 MG capsule, Take 1 capsule (12.5 mg total) by mouth daily., Disp: 90 capsule, Rfl: 1   No Known Allergies    The patient states she uses post menopausal status for birth control. Last LMP was No LMP recorded (lmp unknown). Patient is postmenopausal.. Negative for Dysmenorrhea. Negative for: breast discharge, breast lump(s), breast pain and breast self exam. Associated symptoms include abnormal vaginal bleeding. Pertinent negatives include abnormal bleeding (hematology), anxiety, decreased libido, depression, difficulty falling sleep, dyspareunia, history of infertility, nocturia, sexual dysfunction, sleep disturbances, urinary incontinence, urinary urgency, vaginal discharge and vaginal itching. Diet regular.The patient states her exercise level is  moderate.  . The patient's tobacco use is:  Social History   Tobacco Use  Smoking Status Former   Packs/day: 0.50   Years: 11.00   Pack years: 5.50   Types: Cigarettes   Quit date: 02/06/1995   Years since quitting: 25.7  Smokeless Tobacco Never  . She has been exposed to passive smoke. The patient's alcohol use is:  Social History   Substance and Sexual Activity  Alcohol Use No   Review of Systems  Constitutional: Negative.   HENT: Negative.    Eyes: Negative.  Negative for blurred vision.  Respiratory: Negative.  Negative for shortness of breath.   Cardiovascular: Negative.  Negative for chest pain and palpitations.  Gastrointestinal: Negative.   Endocrine: Negative.   Genitourinary: Negative.   Musculoskeletal: Negative.   Skin: Negative.   Allergic/Immunologic: Negative.   Neurological: Negative.   Hematological: Negative.   Psychiatric/Behavioral: Negative.      Today's Vitals   10/13/20 1114  BP: 122/86  Pulse: 70  Temp: 97.7 F (36.5 C)  TempSrc: Oral  Weight: 178 lb 9.6 oz (81 kg)  Height: 5' 4.6" (1.641 m)   Body mass index is 30.09 kg/m.  Wt Readings from Last 3 Encounters:   10/13/20 178 lb 9.6 oz (81 kg)  02/12/20 183 lb (83 kg)  10/13/19 189 lb 3.2 oz (85.8 kg)   BP Readings from Last 3 Encounters:  10/13/20 122/86  02/12/20 114/72  10/13/19 126/82   Physical Exam Vitals and nursing note reviewed.  Constitutional:      Appearance: Normal appearance.  HENT:     Head: Normocephalic and atraumatic.     Right Ear: Tympanic membrane, ear canal and external ear normal.     Left Ear: Tympanic membrane, ear canal and external ear normal.     Nose:     Comments: Masked     Mouth/Throat:     Comments: Masked  Eyes:     Extraocular Movements: Extraocular movements intact.     Conjunctiva/sclera: Conjunctivae normal.     Pupils: Pupils are equal, round, and reactive to light.  Cardiovascular:     Rate and Rhythm: Normal rate and  regular rhythm.     Pulses:          Dorsalis pedis pulses are 3+ on the right side and 3+ on the left side.     Heart sounds: Normal heart sounds.  Pulmonary:     Effort: Pulmonary effort is normal.     Breath sounds: Normal breath sounds.  Chest:  Breasts:    Tanner Score is 5.     Right: Normal.     Left: Normal.  Abdominal:     General: Bowel sounds are normal.     Palpations: Abdomen is soft.  Genitourinary:    Comments: deferred Musculoskeletal:        General: Normal range of motion.     Cervical back: Normal range of motion and neck supple.  Feet:     Right foot:     Protective Sensation: 5 sites tested.  5 sites sensed.     Skin integrity: Skin integrity normal.     Toenail Condition: Right toenails are normal.     Left foot:     Protective Sensation: 5 sites tested.  5 sites sensed.     Skin integrity: Skin integrity normal.     Toenail Condition: Left toenails are normal.  Skin:    General: Skin is warm and dry.     Comments: Flesh colored skin lesion on left upper eyelid. No surrounding erythema  Neurological:     General: No focal deficit present.     Mental Status: She is alert and oriented to  person, place, and time.  Psychiatric:        Mood and Affect: Mood normal.        Behavior: Behavior normal.        Assessment And Plan:     1. Routine general medical examination at health care facility Comments: A full exam was performed. Importance of monthly self breast exams was discussed with the patient. PATIENT IS ADVISED TO GET 30-45 MINUTES REGULAR EXERCISE NO LESS THAN FOUR TO FIVE DAYS PER WEEK - BOTH WEIGHTBEARING EXERCISES AND AEROBIC ARE RECOMMENDED.  PATIENT IS ADVISED TO FOLLOW A HEALTHY DIET WITH AT LEAST SIX FRUITS/VEGGIES PER DAY, DECREASE INTAKE OF RED MEAT, AND TO INCREASE FISH INTAKE TO TWO DAYS PER WEEK.  MEATS/FISH SHOULD NOT BE FRIED, BAKED OR BROILED IS PREFERABLE.  IT IS ALSO IMPORTANT TO CUT BACK ON YOUR SUGAR INTAKE. PLEASE AVOID ANYTHING WITH ADDED SUGAR, CORN SYRUP OR OTHER SWEETENERS. IF YOU MUST USE A SWEETENER, YOU CAN TRY STEVIA. IT IS ALSO IMPORTANT TO AVOID ARTIFICIALLY SWEETENERS AND DIET BEVERAGES. LASTLY, I SUGGEST WEARING SPF 50 SUNSCREEN ON EXPOSED PARTS AND ESPECIALLY WHEN IN THE DIRECT SUNLIGHT FOR AN EXTENDED PERIOD OF TIME.  PLEASE AVOID FAST FOOD RESTAURANTS AND INCREASE YOUR WATER INTAKE.   2. Uncontrolled type 2 diabetes mellitus with hyperglycemia ([place]) Comments: Diabetic foot exam was performed. I DISCUSSED WITH THE PATIENT AT LENGTH REGARDING THE GOALS OF GLYCEMIC CONTROL AND POSSIBLE LONG-TERM COMPLICATIONS.  I  ALSO STRESSED THE IMPORTANCE OF COMPLIANCE WITH HOME GLUCOSE MONITORING, DIETARY RESTRICTIONS INCLUDING AVOIDANCE OF SUGARY DRINKS/PROCESSED FOODS,  ALONG WITH REGULAR EXERCISE.  I  ALSO STRESSED THE IMPORTANCE OF ANNUAL EYE EXAMS, SELF FOOT CARE AND COMPLIANCE WITH OFFICE VISITS.  - POCT Urinalysis Dipstick (81002) - POCT UA - Microalbumin  3. Essential hypertension, benign Comments: Chronic, controlled. EKG performed, NSR w/o acute changes. Advised to follow low sodium diet. She will f/u in 4-6 months for re-evaluation.  - EKG  12-Lead  4. Pure hypercholesterolemia Comments: Pt's labs reviewed in full during her visit. LDL 131, June 2022.  She does not wish to take statin at this time. She does agree to Cardiology referral for cardiac risk assessment.   The 10-year ASCVD risk score Mikey Bussing DC Brooke Bonito., et al., 2013) is: 8.6%   Values used to calculate the score:     Age: 9 years     Sex: Female     Is Non-Hispanic African American: Yes     Diabetic: Yes     Tobacco smoker: No     Systolic Blood Pressure: 288 mmHg     Is BP treated: Yes     HDL Cholesterol: 60 mg/dL     Total Cholesterol: 210 mg/dL  5. Xanthelasma of left eyelid Comments: Pt advised this is related to elevated cholesterol levels.   6. Class 1 obesity due to excess calories with serious comorbidity and body mass index (BMI) of 31.0 to 31.9 in adult She is encouraged to strive for BMI less than 30 to decrease cardiac risk. Advised to aim for at least 150 minutes of exercise per week.  Patient was given opportunity to ask questions. Patient verbalized understanding of the plan and was able to repeat key elements of the plan. All questions were answered to their satisfaction.   I, [name], MD, have reviewed all documentation for this visit. The documentation on 10/23/20 for the exam, diagnosis, procedures, and orders are all accurate and complete.  THE PATIENT IS ENCOURAGED TO PRACTICE SOCIAL DISTANCING DUE TO THE COVID-19 PANDEMIC.

## 2020-10-13 NOTE — Patient Instructions (Addendum)
Health Maintenance, Female Adopting a healthy lifestyle and getting preventive care are important in promoting health and wellness. Ask your health care provider about:  The right schedule for you to have regular tests and exams.  Things you can do on your own to prevent diseases and keep yourself healthy. What should I know about diet, weight, and exercise? Eat a healthy diet  Eat a diet that includes plenty of vegetables, fruits, low-fat dairy products, and lean protein.  Do not eat a lot of foods that are high in solid fats, added sugars, or sodium.   Maintain a healthy weight Body mass index (BMI) is used to identify weight problems. It estimates body fat based on height and weight. Your health care provider can help determine your BMI and help you achieve or maintain a healthy weight. Get regular exercise Get regular exercise. This is one of the most important things you can do for your health. Most adults should:  Exercise for at least 150 minutes each week. The exercise should increase your heart rate and make you sweat (moderate-intensity exercise).  Do strengthening exercises at least twice a week. This is in addition to the moderate-intensity exercise.  Spend less time sitting. Even light physical activity can be beneficial. Watch cholesterol and blood lipids Have your blood tested for lipids and cholesterol at 54 years of age, then have this test every 5 years. Have your cholesterol levels checked more often if:  Your lipid or cholesterol levels are high.  You are older than 54 years of age.  You are at high risk for heart disease. What should I know about cancer screening? Depending on your health history and family history, you may need to have cancer screening at various ages. This may include screening for:  Breast cancer.  Cervical cancer.  Colorectal cancer.  Skin cancer.  Lung cancer. What should I know about heart disease, diabetes, and high blood  pressure? Blood pressure and heart disease  High blood pressure causes heart disease and increases the risk of stroke. This is more likely to develop in people who have high blood pressure readings, are of African descent, or are overweight.  Have your blood pressure checked: ? Every 3-5 years if you are 18-39 years of age. ? Every year if you are 54 years old or older. Diabetes Have regular diabetes screenings. This checks your fasting blood sugar level. Have the screening done:  Once every three years after age 54 if you are at a normal weight and have a low risk for diabetes.  More often and at a younger age if you are overweight or have a high risk for diabetes. What should I know about preventing infection? Hepatitis B If you have a higher risk for hepatitis B, you should be screened for this virus. Talk with your health care provider to find out if you are at risk for hepatitis B infection. Hepatitis C Testing is recommended for:  Everyone born from 1945 through 1965.  Anyone with known risk factors for hepatitis C. Sexually transmitted infections (STIs)  Get screened for STIs, including gonorrhea and chlamydia, if: ? You are sexually active and are younger than 54 years of age. ? You are older than 54 years of age and your health care provider tells you that you are at risk for this type of infection. ? Your sexual activity has changed since you were last screened, and you are at increased risk for chlamydia or gonorrhea. Ask your health care provider   if you are at risk.  Ask your health care provider about whether you are at high risk for HIV. Your health care provider may recommend a prescription medicine to help prevent HIV infection. If you choose to take medicine to prevent HIV, you should first get tested for HIV. You should then be tested every 3 months for as long as you are taking the medicine. Pregnancy  If you are about to stop having your period (premenopausal) and  you may become pregnant, seek counseling before you get pregnant.  Take 400 to 800 micrograms (mcg) of folic acid every day if you become pregnant.  Ask for birth control (contraception) if you want to prevent pregnancy. Osteoporosis and menopause Osteoporosis is a disease in which the bones lose minerals and strength with aging. This can result in bone fractures. If you are 67 years old or older, or if you are at risk for osteoporosis and fractures, ask your health care provider if you should:  Be screened for bone loss.  Take a calcium or vitamin D supplement to lower your risk of fractures.  Be given hormone replacement therapy (HRT) to treat symptoms of menopause. Follow these instructions at home: Lifestyle  Do not use any products that contain nicotine or tobacco, such as cigarettes, e-cigarettes, and chewing tobacco. If you need help quitting, ask your health care provider.  Do not use street drugs.  Do not share needles.  Ask your health care provider for help if you need support or information about quitting drugs. Alcohol use  Do not drink alcohol if: ? Your health care provider tells you not to drink. ? You are pregnant, may be pregnant, or are planning to become pregnant.  If you drink alcohol: ? Limit how much you use to 0-1 drink a day. ? Limit intake if you are breastfeeding.  Be aware of how much alcohol is in your drink. In the [place] one drink equals one 12 oz bottle of beer (355 mL), one 5 oz glass of wine (148 mL), or one 1 oz glass of hard liquor (44 mL). General instructions  Schedule regular health, dental, and eye exams.  Stay current with your vaccines.  Tell your health care provider if: ? You often feel depressed. ? You have ever been abused or do not feel safe at home. Summary  Adopting a healthy lifestyle and getting preventive care are important in promoting health and wellness.  Follow your health care provider's instructions about healthy  diet, exercising, and getting tested or screened for diseases.  Follow your health care provider's instructions on monitoring your cholesterol and blood pressure. This information is not intended to replace advice given to you by your health care provider. Make sure you discuss any questions you have with your health care provider. Document Revised: 04/17/2018 Document Reviewed: 04/17/2018 Elsevier Patient Education  2021 [place].  The 10-year ASCVD risk score Mikey Bussing DC Brooke Bonito., et al., 2013) is: 8.6%   Values used to calculate the score:     Age: 55 years     Sex: Female     Is Non-Hispanic African American: Yes     Diabetic: Yes     Tobacco smoker: No     Systolic Blood Pressure: 932 mmHg     Is BP treated: Yes     HDL Cholesterol: 60 mg/dL     Total Cholesterol: 210 mg/dL

## 2020-10-23 DIAGNOSIS — E6609 Other obesity due to excess calories: Secondary | ICD-10-CM | POA: Insufficient documentation

## 2020-10-23 DIAGNOSIS — E66811 Obesity, class 1: Secondary | ICD-10-CM | POA: Insufficient documentation

## 2020-10-23 DIAGNOSIS — H0266 Xanthelasma of left eye, unspecified eyelid: Secondary | ICD-10-CM | POA: Insufficient documentation

## 2020-11-01 ENCOUNTER — Other Ambulatory Visit (HOSPITAL_COMMUNITY): Payer: Self-pay

## 2020-11-01 ENCOUNTER — Other Ambulatory Visit: Payer: Self-pay

## 2020-11-01 MED ORDER — OZEMPIC (0.25 OR 0.5 MG/DOSE) 2 MG/1.5ML ~~LOC~~ SOPN
0.5000 mg | PEN_INJECTOR | SUBCUTANEOUS | 2 refills | Status: DC
  Filled 2020-11-01: qty 1.5, 28d supply, fill #0

## 2020-11-11 ENCOUNTER — Other Ambulatory Visit (HOSPITAL_COMMUNITY): Payer: Self-pay

## 2020-11-30 ENCOUNTER — Other Ambulatory Visit: Payer: Self-pay

## 2020-11-30 ENCOUNTER — Encounter: Payer: Self-pay | Admitting: Internal Medicine

## 2020-11-30 ENCOUNTER — Ambulatory Visit (INDEPENDENT_AMBULATORY_CARE_PROVIDER_SITE_OTHER): Payer: No Typology Code available for payment source | Admitting: Internal Medicine

## 2020-11-30 VITALS — BP 122/76 | HR 76 | Temp 99.2°F | Ht 64.6 in | Wt 182.6 lb

## 2020-11-30 DIAGNOSIS — M25571 Pain in right ankle and joints of right foot: Secondary | ICD-10-CM

## 2020-11-30 DIAGNOSIS — M542 Cervicalgia: Secondary | ICD-10-CM

## 2020-11-30 DIAGNOSIS — W19XXXA Unspecified fall, initial encounter: Secondary | ICD-10-CM

## 2020-11-30 DIAGNOSIS — M25572 Pain in left ankle and joints of left foot: Secondary | ICD-10-CM

## 2020-11-30 MED ORDER — KETOROLAC TROMETHAMINE 60 MG/2ML IM SOLN
60.0000 mg | Freq: Once | INTRAMUSCULAR | Status: AC
  Administered 2020-11-30: 60 mg via INTRAMUSCULAR

## 2020-11-30 NOTE — Progress Notes (Signed)
I,Katawbba Wiggins,acting as a Education administrator for [name], MD.,have documented all relevant documentation on the behalf of [name], MD,as directed by  [name], MD while in the presence of [name], MD.  This visit occurred during the SARS-CoV-2 public health emergency.  Safety protocols were in place, including screening questions prior to the visit, additional usage of staff PPE, and extensive cleaning of exam room while observing appropriate contact time as indicated for disinfecting solutions.  Subjective:     Patient ID: Sandra Sims , female    DOB: 12/28/1965 , 55 y.o.   MRN: [id number]   Chief Complaint  Patient presents with   Fall    HPI  She is here today for further evaluation after a fall. She reports on Saturday, 7/23 - she was stepping into her husband's SUV with a banana pudding tray when she fell. She states she stepped onto the running board with her left foot, and her right foot was dangling. As she was trying to step back down onto the driveway, she lost her footing and fell backwards. She states she "rolled" her right ankle, hit her head/back on the grass, she states she then went into the fetal position. She was able to get herself up and limp back into the house. She admits she was wearing flip-flops. She did not experience any dizziness, headache. She took two Aleve in anticipation of having some muscle pains. She denies loss of consciousness.   The next day, while in church she realized she had neck pain. Monday morning, she noticed bruising on her right foot and right foot swelling.     Past Medical History:  Diagnosis Date   Hypertension    PMB (postmenopausal bleeding)    Sickle cell trait (HCC)    Thickened endometrium    Type 2 diabetes mellitus ([place])    followed by pcp   Uterine fibroid    Wears glasses      Family History  Problem Relation Age of Onset   Colon polyps Mother    Stroke Father    Colon cancer Neg Hx     Esophageal cancer Neg Hx    Rectal cancer Neg Hx    Stomach cancer Neg Hx    Liver disease Neg Hx    Pancreatic cancer Neg Hx    Prostate cancer Neg Hx      Current Outpatient Medications:    BLACK ELDERBERRY,BERRY-FLOWER, PO, Take by mouth daily. , Disp: , Rfl:    chlorpheniramine-HYDROcodone (TUSSIONEX PENNKINETIC ER) 10-8 MG/5ML SUER, Take 5 mLs by mouth every 12 (twelve) hours as needed for cough., Disp: 140 mL, Rfl: 0   cholecalciferol (VITAMIN D) 1000 units tablet, Take 1,000 Units by mouth daily., Disp: , Rfl:    cyclobenzaprine (FLEXERIL) 10 MG tablet, Take 1 tablet (10 mg total) by mouth at bedtime as needed., Disp: 30 tablet, Rfl: 0   hydrochlorothiazide (MICROZIDE) 12.5 MG capsule, Take 1 capsule (12.5 mg total) by mouth daily., Disp: 90 capsule, Rfl: 1   ibuprofen (ADVIL,MOTRIN) 800 MG tablet, Take 800 mg by mouth every 8 (eight) hours as needed., Disp: , Rfl:    magnesium oxide (MAG-OX) 400 MG tablet, Take 400 mg by mouth as needed. , Disp: , Rfl:    mometasone (NASONEX) 50 MCG/ACT nasal spray, Place 2 sprays into the nose daily as needed. , Disp: , Rfl: 2   NON FORMULARY, Sea moss, Disp: , Rfl:    Omega-3 Fatty Acids (  FISH OIL PO), Take 1,000 mg by mouth daily. , Disp: , Rfl:    omeprazole (PRILOSEC) 40 MG capsule, SMARTSIG:1 Capsule(s) By Mouth Every Evening, Disp: , Rfl:    OVER THE COUNTER MEDICATION, Apple cider vinegar 2 tsp daily, Disp: , Rfl:    Semaglutide,0.25 or 0.'5MG'$ /DOS, (OZEMPIC, 0.25 OR 0.5 MG/DOSE,) 2 MG/1.5ML SOPN, Inject 0.5 mg into the skin once a week., Disp: 1.5 mL, Rfl: 2   vitamin C (ASCORBIC ACID) 500 MG tablet, Take 500 mg by mouth as needed. , Disp: , Rfl:    No Known Allergies   Review of Systems  Constitutional: Negative.   Respiratory: Negative.    Cardiovascular: Negative.   Gastrointestinal: Negative.   Musculoskeletal:  Positive for arthralgias.  Neurological: Negative.   Psychiatric/Behavioral: Negative.      Today's Vitals   11/30/20  1216  BP: 122/76  Pulse: 76  Temp: 99.2 F (37.3 C)  TempSrc: Oral  Weight: 182 lb 9.6 oz (82.8 kg)  Height: 5' 4.6" (1.641 m)  PainSc: 0-No pain   Body mass index is 30.76 kg/m.  Wt Readings from Last 3 Encounters:  11/30/20 182 lb 9.6 oz (82.8 kg)  10/13/20 178 lb 9.6 oz (81 kg)  02/12/20 183 lb (83 kg)    BP Readings from Last 3 Encounters:  11/30/20 122/76  10/13/20 122/86  02/12/20 114/72    Objective:  Physical Exam Vitals and nursing note reviewed.  Constitutional:      Appearance: Normal appearance.  HENT:     Head: Normocephalic and atraumatic.     Nose:     Comments: Masked     Mouth/Throat:     Comments: Masked  Cardiovascular:     Rate and Rhythm: Normal rate and regular rhythm.     Heart sounds: Normal heart sounds.  Pulmonary:     Effort: Pulmonary effort is normal.     Breath sounds: Normal breath sounds.  Musculoskeletal:        General: Swelling and tenderness present.     Comments: Right foot swelling, tenderness to palpation  Skin:    General: Skin is warm.     Findings: Bruising present.  Neurological:     General: No focal deficit present.     Mental Status: She is alert.  Psychiatric:        Mood and Affect: Mood normal.        Behavior: Behavior normal.        Assessment And Plan:     1. Acute left ankle pain Comments: I will give her Toradol '60mg'$  IM x1. She does not wish to see Ortho at this time. Advised to apply Voltaren gel 2-3x/daily. Advised to call in 48 hrs w/ update.  - ketorolac (TORADOL) injection 60 mg  2. Cervicalgia Comments: She was given some stretching exercises to perform. She is not experiencing any radiculopathy at this time.  - ketorolac (TORADOL) injection 60 mg  3. Fall, initial encounter Comments: Occurred on 7/23.     Patient was given opportunity to ask questions. Patient verbalized understanding of the plan and was able to repeat key elements of the plan. All questions were answered to their  satisfaction.   I, [name], MD, have reviewed all documentation for this visit. The documentation on 12/13/20 for the exam, diagnosis, procedures, and orders are all accurate and complete.   IF YOU HAVE BEEN REFERRED TO A SPECIALIST, IT MAY TAKE 1-2 WEEKS TO SCHEDULE/PROCESS THE REFERRAL. IF YOU HAVE  NOT HEARD FROM US/SPECIALIST IN TWO WEEKS, PLEASE GIVE [place] A CALL AT [phone].   THE PATIENT IS ENCOURAGED TO PRACTICE SOCIAL DISTANCING DUE TO THE COVID-19 PANDEMIC.

## 2020-12-10 ENCOUNTER — Ambulatory Visit
Admission: RE | Admit: 2020-12-10 | Discharge: 2020-12-10 | Disposition: A | Discharge status: Home / Self Care | Payer: No Typology Code available for payment source | Source: Ambulatory Visit | Attending: Internal Medicine | Admitting: Internal Medicine

## 2020-12-10 ENCOUNTER — Other Ambulatory Visit: Payer: Self-pay

## 2020-12-10 DIAGNOSIS — Z1231 Encounter for screening mammogram for malignant neoplasm of breast: Secondary | ICD-10-CM

## 2020-12-13 ENCOUNTER — Other Ambulatory Visit: Payer: Self-pay | Admitting: Internal Medicine

## 2020-12-13 DIAGNOSIS — M542 Cervicalgia: Secondary | ICD-10-CM

## 2020-12-13 DIAGNOSIS — W19XXXA Unspecified fall, initial encounter: Secondary | ICD-10-CM

## 2020-12-13 DIAGNOSIS — M25572 Pain in left ankle and joints of left foot: Secondary | ICD-10-CM

## 2021-02-03 ENCOUNTER — Encounter (HOSPITAL_BASED_OUTPATIENT_CLINIC_OR_DEPARTMENT_OTHER): Payer: Self-pay | Admitting: Cardiovascular Disease

## 2021-02-03 ENCOUNTER — Ambulatory Visit (HOSPITAL_BASED_OUTPATIENT_CLINIC_OR_DEPARTMENT_OTHER): Payer: No Typology Code available for payment source | Admitting: Cardiovascular Disease

## 2021-02-03 ENCOUNTER — Other Ambulatory Visit: Payer: Self-pay

## 2021-02-03 VITALS — BP 126/84 | HR 74 | Ht 64.0 in | Wt 183.7 lb

## 2021-02-03 DIAGNOSIS — I10 Essential (primary) hypertension: Secondary | ICD-10-CM

## 2021-02-03 DIAGNOSIS — E7800 Pure hypercholesterolemia, unspecified: Secondary | ICD-10-CM | POA: Diagnosis not present

## 2021-02-03 NOTE — Patient Instructions (Signed)
Medication Instructions:  Your physician recommends that you continue on your current medications as directed. Please refer to the Current Medication list given to you today.   *If you need a refill on your cardiac medications before your next appointment, please call your pharmacy*  Lab Work: NONE  Testing/Procedures: CALCIUM SCORE, THIS WILL COST YOU $99 OUT OF POCKET  Follow-Up: At [facility], you and your health needs are our priority.  As part of our continuing mission to provide you with exceptional heart care, we have created designated Provider Care Teams.  These Care Teams include your primary Cardiologist (physician) and Advanced Practice Providers (APPs -  Physician Assistants and Nurse Practitioners) who all work together to provide you with the care you need, when you need it.  We recommend signing up for the patient portal called "MyChart".  Sign up information is provided on this After Visit Summary.  MyChart is used to connect with patients for Virtual Visits (Telemedicine).  Patients are able to view lab/test results, encounter notes, upcoming appointments, etc.  Non-urgent messages can be sent to your provider as well.   To learn more about what you can do with MyChart, go to [email or web address].    Your next appointment:   3 month(s)  The format for your next appointment:   In Person  Provider:   Skeet Latch, MD

## 2021-02-03 NOTE — Progress Notes (Signed)
Cardiology Office Note:    Date:  02/03/2021   ID:  Sandra Sims, DOB [birth year], MRN [id number]  PCP:  Glendale Chard, MD   [place] Providers Cardiologist:  None     Referring MD: Glendale Chard, MD   No chief complaint on file.   History of Present Illness:    Sandra Sims is a 55 y.o. female with a hx of hyperlipidemia, diabetes mellitus type 2, and hypertension, here for hyperlipidemia management and counseling for cardiac risk prevention.  Today, she is generally feeling well. She reports that she does not formally exercise as much as she should. In a normal week she attempts to go to the gym at least 3 times, but she is not always successful. For her diet, she mostly cooks her meals at home, and tries to avoid salt. If she uses salt she will use pink Himalayan salt. Typically she does not drink caffeine. Frequent sweets includes peanut butter and chocolate. Previously she was taking supplements, but does not remember which. She is a former smoker, and quit 26 years ago. She denies any palpitations, chest pain, or shortness of breath. No lightheadedness, headaches, syncope, orthopnea, or PND. Also has no lower extremity edema or exertional symptoms. Her ASCVD 10-year risk score is 5.5%.   Past Medical History:  Diagnosis Date   Hypertension    PMB (postmenopausal bleeding)    Sickle cell trait ([place])    Thickened endometrium    Type 2 diabetes mellitus ([place])    followed by pcp   Uterine fibroid    Wears glasses     Past Surgical History:  Procedure Laterality Date   DILATATION & CURETTAGE/HYSTEROSCOPY WITH MYOSURE N/A 12/10/2018   Procedure: [place];  Surgeon: Servando Salina, MD;  Location: [place];  Service: Gynecology;  Laterality: N/A;   DILATION AND CURETTAGE OF UTERUS  yrs ago   IR GENERIC HISTORICAL  06/22/2016   IR RADIOLOGIST EVAL & MGMT 06/22/2016 [facility] INTERV RAD   TUBAL LIGATION  Bilateral yrs ago    Current Medications: Current Meds  Medication Sig   BLACK ELDERBERRY,BERRY-FLOWER, PO Take by mouth daily.    chlorpheniramine-HYDROcodone (TUSSIONEX PENNKINETIC ER) 10-8 MG/5ML SUER Take 5 mLs by mouth every 12 (twelve) hours as needed for cough.   cholecalciferol (VITAMIN D) 1000 units tablet Take 1,000 Units by mouth daily.   cyclobenzaprine (FLEXERIL) 10 MG tablet Take 1 tablet (10 mg total) by mouth at bedtime as needed.   hydrochlorothiazide (MICROZIDE) 12.5 MG capsule Take 1 capsule (12.5 mg total) by mouth daily.   ibuprofen (ADVIL,MOTRIN) 800 MG tablet Take 800 mg by mouth every 8 (eight) hours as needed.   magnesium oxide (MAG-OX) 400 MG tablet Take 400 mg by mouth as needed.    mometasone (NASONEX) 50 MCG/ACT nasal spray Place 2 sprays into the nose daily as needed.    NON FORMULARY Sea moss   Omega-3 Fatty Acids (FISH OIL PO) Take 1,000 mg by mouth daily.    omeprazole (PRILOSEC) 40 MG capsule SMARTSIG:1 Capsule(s) By Mouth Every Evening   OVER THE COUNTER MEDICATION Apple cider vinegar 2 tsp daily   Semaglutide,0.25 or 0.5MG /DOS, (OZEMPIC, 0.25 OR 0.5 MG/DOSE,) 2 MG/1.5ML SOPN Inject 0.5 mg into the skin once a week.   vitamin C (ASCORBIC ACID) 500 MG tablet Take 500 mg by mouth as needed.      Allergies:   Patient has no known allergies.   Social History   Socioeconomic  History   Marital status: Married    Spouse name: Not on file   Number of children: Not on file   Years of education: Not on file   Highest education level: Not on file  Occupational History   Not on file  Tobacco Use   Smoking status: Former    Packs/day: 0.50    Years: 11.00    Pack years: 5.50    Types: Cigarettes    Quit date: 02/06/1995    Years since quitting: 26.0   Smokeless tobacco: Never  Vaping Use   Vaping Use: Never used  Substance and Sexual Activity   Alcohol use: No   Drug use: Never   Sexual activity: Yes    Birth control/protection: Surgical  Other  Topics Concern   Not on file  Social History Narrative   Not on file   Social Determinants of Health   Financial Resource Strain: Not on file  Food Insecurity: Not on file  Transportation Needs: Not on file  Physical Activity: Not on file  Stress: Not on file  Social Connections: Not on file     Family History: The patient's family history includes Colon polyps in her mother; Hypertension in her mother; Stroke in her father. There is no history of Colon cancer, Esophageal cancer, Rectal cancer, Stomach cancer, Liver disease, Pancreatic cancer, or Prostate cancer.  ROS:   Please see the history of present illness.    All other systems reviewed and are negative.  EKGs/Labs/Other Studies Reviewed:    The following studies were reviewed today: No prior CV studies available.  EKG:    02/03/2021: EKG is not ordered today.   Recent Labs: 10/12/2020: ALT 19; BUN 7; Creatinine, Ser 0.76; Hemoglobin 13.3; Platelets 280; Potassium 3.9; Sodium 141   Recent Lipid Panel    Component Value Date/Time   CHOL 210 (H) 10/12/2020 1605   TRIG 108 10/12/2020 1605   HDL 60 10/12/2020 1605   CHOLHDL 3.5 10/12/2020 1605   LDLCALC 131 (H) 10/12/2020 1605        Physical Exam:    Wt Readings from Last 3 Encounters:  02/03/21 183 lb 11.2 oz (83.3 kg)  11/30/20 182 lb 9.6 oz (82.8 kg)  10/13/20 178 lb 9.6 oz (81 kg)     VS:  BP 126/84 (BP Location: Left Arm, Patient Position: Sitting, Cuff Size: Large)   Pulse 74   Ht 5\' 4"  (1.626 m)   Wt 183 lb 11.2 oz (83.3 kg)   LMP  (LMP Unknown)   SpO2 99%   BMI 31.53 kg/m  , BMI Body mass index is 31.53 kg/m. GENERAL:  Well appearing HEENT: Pupils equal round and reactive, fundi not visualized, oral mucosa unremarkable NECK:  No jugular venous distention, waveform within normal limits, carotid upstroke brisk and symmetric, no bruits LUNGS:  Clear to auscultation bilaterally HEART:  RRR.  PMI not displaced or sustained,S1 and S2 within normal  limits, no S3, no S4, no clicks, no rubs, no murmurs ABD:  Flat, positive bowel sounds normal in frequency in pitch, no bruits, no rebound, no guarding, no midline pulsatile mass, no hepatomegaly, no splenomegaly EXT:  2 plus pulses throughout, no edema, no cyanosis no clubbing SKIN:  No rashes no nodules NEURO:  Cranial nerves II through XII grossly intact, motor grossly intact throughout PSYCH:  Cognitively intact, oriented to person place and time   ASSESSMENT:    1. Pure hypercholesterolemia   2. Essential hypertension, benign    PLAN:  Essential hypertension, benign BP is near goal.  She should be <130/80.  She will work on reducing her sodium intake and increasing exercise to at least 150 minutes weekly.    Pure hypercholesterolemia ASCVD 10 year risk 5.5%.  She is going to work on diet and exercise.  She is also interested in getting a coronary calcium score to better assess her risk.    Disposition: FU with Alessander Sikorski C. Oval Linsey, MD, [facility] in 3 months.  Medication Adjustments/Labs and Tests Ordered: Current medicines are reviewed at length with the patient today.  Concerns regarding medicines are outlined above.   Orders Placed This Encounter  Procedures   CT CARDIAC SCORING (SELF PAY ONLY)    No orders of the defined types were placed in this encounter.  Patient Instructions  Medication Instructions:  Your physician recommends that you continue on your current medications as directed. Please refer to the Current Medication list given to you today.   *If you need a refill on your cardiac medications before your next appointment, please call your pharmacy*  Lab Work: NONE  Testing/Procedures: CALCIUM SCORE, THIS WILL COST YOU $99 OUT OF POCKET  Follow-Up: At [facility], you and your health needs are our priority.  As part of our continuing mission to provide you with exceptional heart care, we have created designated Provider Care Teams.  These Care Teams  include your primary Cardiologist (physician) and Advanced Practice Providers (APPs -  Physician Assistants and Nurse Practitioners) who all work together to provide you with the care you need, when you need it.  We recommend signing up for the patient portal called "MyChart".  Sign up information is provided on this After Visit Summary.  MyChart is used to connect with patients for Virtual Visits (Telemedicine).  Patients are able to view lab/test results, encounter notes, upcoming appointments, etc.  Non-urgent messages can be sent to your provider as well.   To learn more about what you can do with MyChart, go to [email or web address].    Your next appointment:   3 month(s)  The format for your next appointment:   In Person  Provider:   Skeet Latch, MD      [facility] Stumpf,acting as a scribe for Skeet Latch, MD.,have documented all relevant documentation on the behalf of Skeet Latch, MD,as directed by  Skeet Latch, MD while in the presence of Skeet Latch, MD.   I, [place] Oval Linsey, MD have reviewed all documentation for this visit.  The documentation of the exam, diagnosis, procedures, and orders on 02/03/2021 are all accurate and complete.   Signed, Skeet Latch, MD  02/03/2021 6:17 PM    [place]

## 2021-02-03 NOTE — Assessment & Plan Note (Signed)
BP is near goal.  She should be <130/80.  She will work on reducing her sodium intake and increasing exercise to at least 150 minutes weekly.

## 2021-02-03 NOTE — Assessment & Plan Note (Signed)
ASCVD 10 year risk 5.5%.  She is going to work on diet and exercise.  She is also interested in getting a coronary calcium score to better assess her risk.

## 2021-03-01 ENCOUNTER — Other Ambulatory Visit: Payer: Self-pay

## 2021-03-01 ENCOUNTER — Ambulatory Visit (INDEPENDENT_AMBULATORY_CARE_PROVIDER_SITE_OTHER): Payer: No Typology Code available for payment source

## 2021-03-01 VITALS — BP 122/88 | HR 68 | Temp 98.1°F | Ht 64.0 in | Wt 185.0 lb

## 2021-03-01 DIAGNOSIS — Z23 Encounter for immunization: Secondary | ICD-10-CM | POA: Diagnosis not present

## 2021-03-01 NOTE — Progress Notes (Signed)
Pt here for flu shot

## 2021-03-10 ENCOUNTER — Other Ambulatory Visit (HOSPITAL_COMMUNITY): Payer: Self-pay

## 2021-03-11 ENCOUNTER — Other Ambulatory Visit (HOSPITAL_BASED_OUTPATIENT_CLINIC_OR_DEPARTMENT_OTHER): Payer: Self-pay | Admitting: Cardiovascular Disease

## 2021-03-11 DIAGNOSIS — E7800 Pure hypercholesterolemia, unspecified: Secondary | ICD-10-CM

## 2021-05-13 ENCOUNTER — Other Ambulatory Visit: Payer: Self-pay

## 2021-05-13 ENCOUNTER — Ambulatory Visit (INDEPENDENT_AMBULATORY_CARE_PROVIDER_SITE_OTHER)
Admission: RE | Admit: 2021-05-13 | Discharge: 2021-05-13 | Disposition: A | Discharge status: Home / Self Care | Payer: Self-pay | Source: Ambulatory Visit | Attending: Cardiovascular Disease | Admitting: Cardiovascular Disease

## 2021-05-13 DIAGNOSIS — E7800 Pure hypercholesterolemia, unspecified: Secondary | ICD-10-CM

## 2021-05-20 ENCOUNTER — Encounter (HOSPITAL_BASED_OUTPATIENT_CLINIC_OR_DEPARTMENT_OTHER): Payer: Self-pay | Admitting: Cardiovascular Disease

## 2021-05-20 ENCOUNTER — Other Ambulatory Visit (HOSPITAL_COMMUNITY): Payer: Self-pay

## 2021-05-20 ENCOUNTER — Ambulatory Visit (HOSPITAL_BASED_OUTPATIENT_CLINIC_OR_DEPARTMENT_OTHER): Payer: No Typology Code available for payment source | Admitting: Cardiovascular Disease

## 2021-05-20 ENCOUNTER — Other Ambulatory Visit: Payer: Self-pay

## 2021-05-20 VITALS — BP 144/86 | HR 71 | Ht 64.0 in | Wt 188.1 lb

## 2021-05-20 DIAGNOSIS — I700 Atherosclerosis of aorta: Secondary | ICD-10-CM | POA: Diagnosis not present

## 2021-05-20 DIAGNOSIS — I10 Essential (primary) hypertension: Secondary | ICD-10-CM | POA: Diagnosis not present

## 2021-05-20 DIAGNOSIS — I2510 Atherosclerotic heart disease of native coronary artery without angina pectoris: Secondary | ICD-10-CM | POA: Diagnosis not present

## 2021-05-20 DIAGNOSIS — E7800 Pure hypercholesterolemia, unspecified: Secondary | ICD-10-CM | POA: Diagnosis not present

## 2021-05-20 DIAGNOSIS — Z5181 Encounter for therapeutic drug level monitoring: Secondary | ICD-10-CM

## 2021-05-20 HISTORY — DX: Atherosclerosis of aorta: I70.0

## 2021-05-20 HISTORY — DX: Atherosclerotic heart disease of native coronary artery without angina pectoris: I25.10

## 2021-05-20 MED ORDER — HYDROCHLOROTHIAZIDE 25 MG PO TABS
25.0000 mg | ORAL_TABLET | Freq: Every day | ORAL | 3 refills | Status: DC
  Filled 2021-05-20: qty 90, 90d supply, fill #0
  Filled 2021-09-09: qty 90, 90d supply, fill #1
  Filled 2022-02-27: qty 90, 90d supply, fill #2

## 2021-05-20 MED ORDER — ROSUVASTATIN CALCIUM 20 MG PO TABS
20.0000 mg | ORAL_TABLET | Freq: Every day | ORAL | 3 refills | Status: DC
  Filled 2021-05-20: qty 90, 90d supply, fill #0
  Filled 2021-09-09: qty 90, 90d supply, fill #1
  Filled 2021-12-05: qty 90, 90d supply, fill #2

## 2021-05-20 NOTE — Patient Instructions (Addendum)
Medication Instructions:  INCREASE YOUR HYDROCHLOROTHIAZIDE TO 25 MG DAILY   START ROSUVASTATIN 20 MG DAILY   *If you need a refill on your cardiac medications before your next appointment, please call your pharmacy*  Lab Work: BMET IN 1 WEEK  FASTING LP/CMET IN 3 MONTHS PRIOR TO FOLLOW UP   If you have labs (blood work) drawn today and your tests are completely normal, you will receive your results only by: [place] (if you have MyChart) OR A paper copy in the mail If you have any lab test that is abnormal or we need to change your treatment, we will call you to review the results.  Testing/Procedures: NONE  Follow-Up: At [facility], you and your health needs are our priority.  As part of our continuing mission to provide you with exceptional heart care, we have created designated Provider Care Teams.  These Care Teams include your primary Cardiologist (physician) and Advanced Practice Providers (APPs -  Physician Assistants and Nurse Practitioners) who all work together to provide you with the care you need, when you need it.  We recommend signing up for the patient portal called "MyChart".  Sign up information is provided on this After Visit Summary.  MyChart is used to connect with patients for Virtual Visits (Telemedicine).  Patients are able to view lab/test results, encounter notes, upcoming appointments, etc.  Non-urgent messages can be sent to your provider as well.   To learn more about what you can do with MyChart, go to [email or web address].    Your next appointment:   08/26/2021 AT 9:40 AM  WITH DR [facility] AFTER LABS   Other Instructions Exercise recommendations: The American Heart Association recommends 150 minutes of moderate intensity exercise weekly. Try 30 minutes of moderate intensity exercise 4-5 times per week. This could include walking, jogging, or swimming.

## 2021-05-20 NOTE — Assessment & Plan Note (Signed)
LDL goal is less than 70.  She has not been able to start exercising and she plan.  Recommend getting at least 150 minutes weekly.  Start rosuvastatin as above.

## 2021-05-20 NOTE — Assessment & Plan Note (Addendum)
Calcium score 0.6, 80th percentile on 05/2021.  She is taking aspirin 81 mg.  We will add rosuvastatin 20 mg and check lipids and a CMP in 3 months.  She was encouraged to keep limiting the meat products in her diet.  Encouraged her to exercise at least 150 minutes weekly.

## 2021-05-20 NOTE — Progress Notes (Signed)
Cardiology Office Note:    Date:  05/20/2021   ID:  Sandra Sims, DOB [birth year], MRN [id number]  PCP:  Glendale Chard, MD   [facility] Providers Cardiologist:  None     Referring MD: Glendale Chard, MD   No chief complaint on file.  History of Present Illness:    Sandra Sims is a 56 y.o. female with a hx of hyperlipidemia, diabetes mellitus type 2, and hypertension, here for follow-up.  She was initially seen 01/2021 for hyperlipidemia management and counseling for cardiac risk prevention.  At the time she was feeling well but had room for improvement with lifestyle measures.  We discussed increasing her exercise and limiting sodium intake with plans to reassess her blood pressure at follow-up.  Her ASCVD 10-year risk or was 5.5%.  She got a coronary calcium score which revealed a score of 0.4, however it was 80th percentile for age and gender.  She also had a small amount of atherosclerosis in the aortic arch.  She has been reducing carbohydrate intake.  She is increase green vegetable and is limiting meat intake.  She struggles to get up and exercise in the morning.  She has no exertional chest pain or shortness of breath.  She denies lower extremity edema, orthopnea, or PND.  She does not want to be on more medication if at all possible.  She works in [employer] office.   Past Medical History:  Diagnosis Date   Atherosclerosis of aorta ([place]) 05/20/2021   CAD in native artery 05/20/2021   Hypertension    PMB (postmenopausal bleeding)    Sickle cell trait ([place])    Thickened endometrium    Type 2 diabetes mellitus ([place])    followed by pcp   Uterine fibroid    Wears glasses     Past Surgical History:  Procedure Laterality Date   DILATATION & CURETTAGE/HYSTEROSCOPY WITH MYOSURE N/A 12/10/2018   Procedure: [place];  Surgeon: Servando Salina, MD;  Location: [place];  Service: Gynecology;  Laterality: N/A;    DILATION AND CURETTAGE OF UTERUS  yrs ago   IR GENERIC HISTORICAL  06/22/2016   IR RADIOLOGIST EVAL & MGMT 06/22/2016 [facility] INTERV RAD   TUBAL LIGATION Bilateral yrs ago    Current Medications: Current Meds  Medication Sig   cholecalciferol (VITAMIN D) 1000 units tablet Take 1,000 Units by mouth daily.   cyclobenzaprine (FLEXERIL) 10 MG tablet Take 1 tablet (10 mg total) by mouth at bedtime as needed.   hydrochlorothiazide (HYDRODIURIL) 25 MG tablet Take 1 tablet (25 mg total) by mouth daily.   ibuprofen (ADVIL,MOTRIN) 800 MG tablet Take 800 mg by mouth every 8 (eight) hours as needed.   magnesium oxide (MAG-OX) 400 MG tablet Take 400 mg by mouth as needed.    mometasone (NASONEX) 50 MCG/ACT nasal spray Place 2 sprays into the nose daily as needed.    NON FORMULARY Sea moss   Omega-3 Fatty Acids (FISH OIL PO) Take 1,000 mg by mouth daily.    omeprazole (PRILOSEC) 40 MG capsule SMARTSIG:1 Capsule(s) By Mouth Every Evening   OVER THE COUNTER MEDICATION Apple cider vinegar 2 tsp daily   rosuvastatin (CRESTOR) 20 MG tablet Take 1 tablet (20 mg total) by mouth daily.   Semaglutide,0.25 or 0.5MG /DOS, (OZEMPIC, 0.25 OR 0.5 MG/DOSE,) 2 MG/1.5ML SOPN Inject 0.5 mg into the skin once a week.   vitamin C (ASCORBIC ACID) 500 MG tablet Take 500 mg by mouth  as needed.    [DISCONTINUED] hydrochlorothiazide (MICROZIDE) 12.5 MG capsule Take 1 capsule (12.5 mg total) by mouth daily.     Allergies:   Patient has no known allergies.   Social History   Socioeconomic History   Marital status: Married    Spouse name: Not on file   Number of children: Not on file   Years of education: Not on file   Highest education level: Not on file  Occupational History   Not on file  Tobacco Use   Smoking status: Former    Packs/day: 0.50    Years: 11.00    Pack years: 5.50    Types: Cigarettes    Quit date: 02/06/1995    Years since quitting: 26.3   Smokeless tobacco: Never  Vaping Use   Vaping Use:  Never used  Substance and Sexual Activity   Alcohol use: No   Drug use: Never   Sexual activity: Yes    Birth control/protection: Surgical  Other Topics Concern   Not on file  Social History Narrative   Not on file   Social Determinants of Health   Financial Resource Strain: Not on file  Food Insecurity: Not on file  Transportation Needs: Not on file  Physical Activity: Not on file  Stress: Not on file  Social Connections: Not on file     Family History: The patient's family history includes Colon polyps in her mother; Hypertension in her mother; Stroke in her father. There is no history of Colon cancer, Esophageal cancer, Rectal cancer, Stomach cancer, Liver disease, Pancreatic cancer, or Prostate cancer.  ROS:   Please see the history of present illness.    All other systems reviewed and are negative.  EKGs/Labs/Other Studies Reviewed:    The following studies were reviewed today: No prior CV studies available.  EKG:    02/03/2021: EKG is not ordered today.  Coronary calcium score 05/13/21: IMPRESSION: 1. Coronary calcium score of 0.4. This was 41 percentile for age-, race-, and sex-matched controls.   2.  Small focal atherosclerosis of aortic arch.  Recent Labs: 10/12/2020: ALT 19; BUN 7; Creatinine, Ser 0.76; Hemoglobin 13.3; Platelets 280; Potassium 3.9; Sodium 141   Recent Lipid Panel    Component Value Date/Time   CHOL 210 (H) 10/12/2020 1605   TRIG 108 10/12/2020 1605   HDL 60 10/12/2020 1605   CHOLHDL 3.5 10/12/2020 1605   LDLCALC 131 (H) 10/12/2020 1605        Physical Exam:    Wt Readings from Last 3 Encounters:  05/20/21 188 lb 1.6 oz (85.3 kg)  03/01/21 185 lb (83.9 kg)  02/03/21 183 lb 11.2 oz (83.3 kg)     VS:  BP (!) 144/86    Pulse 71    Ht 5\' 4"  (1.626 m)    Wt 188 lb 1.6 oz (85.3 kg)    LMP  (LMP Unknown)    SpO2 99%    BMI 32.29 kg/m  , BMI Body mass index is 32.29 kg/m. GENERAL:  Well appearing HEENT: Pupils equal round and  reactive, fundi not visualized, oral mucosa unremarkable NECK:  No jugular venous distention, waveform within normal limits, carotid upstroke brisk and symmetric, no bruits, no thyromegaly LUNGS:  Clear to auscultation bilaterally HEART:  RRR.  PMI not displaced or sustained,S1 and S2 within normal limits, no S3, no S4, no clicks, no rubs, no murmurs ABD:  Flat, positive bowel sounds normal in frequency in pitch, no bruits, no rebound, no guarding, no  midline pulsatile mass, no hepatomegaly, no splenomegaly EXT:  2 plus pulses throughout, no edema, no cyanosis no clubbing SKIN:  No rashes no nodules NEURO:  Cranial nerves II through XII grossly intact, motor grossly intact throughout PSYCH:  Cognitively intact, oriented to person place and time    ASSESSMENT:    1. Essential hypertension, benign   2. CAD in native artery   3. Atherosclerosis of aorta ([place])   4. Pure hypercholesterolemia   5. Therapeutic drug monitoring     PLAN:    CAD in native artery Calcium score 0.6, 80th percentile on 05/2021.  She is taking aspirin 81 mg.  We will add rosuvastatin 20 mg and check lipids and a CMP in 3 months.  She was encouraged to keep limiting the meat products in her diet.  Encouraged her to exercise at least 150 minutes weekly.  Atherosclerosis of aorta ([place]) Noted on CT.  Start rosuvastatin 20 mg.  LDL goal is less than 70.  Essential hypertension, benign Blood pressure remains poorly controlled both initially and on repeat.  She has made some dietary changes.  We encouraged her to start exercising at least 150 minutes weekly.  We will increase HCTZ to 25 mg.  She will track her blood pressures and bring back to follow-up.  BMP in a week.  Pure hypercholesterolemia LDL goal is less than 70.  She has not been able to start exercising and she plan.  Recommend getting at least 150 minutes weekly.  Start rosuvastatin as above.    Disposition: FU with Jinnie Onley C. Oval Linsey, MD, [facility] in 3  months.  Medication Adjustments/Labs and Tests Ordered: Current medicines are reviewed at length with the patient today.  Concerns regarding medicines are outlined above.   Orders Placed This Encounter  Procedures   Lipid panel   Comprehensive metabolic panel   Basic metabolic panel    Meds ordered this encounter  Medications   rosuvastatin (CRESTOR) 20 MG tablet    Sig: Take 1 tablet (20 mg total) by mouth daily.    Dispense:  90 tablet    Refill:  3   hydrochlorothiazide (HYDRODIURIL) 25 MG tablet    Sig: Take 1 tablet (25 mg total) by mouth daily.    Dispense:  90 tablet    Refill:  3    D/C 12.5 MG RX, NEW DOSE    Patient Instructions  Medication Instructions:  INCREASE YOUR HYDROCHLOROTHIAZIDE TO 25 MG DAILY   START ROSUVASTATIN 20 MG DAILY   *If you need a refill on your cardiac medications before your next appointment, please call your pharmacy*  Lab Work: BMET IN 1 WEEK  FASTING LP/CMET IN 3 MONTHS PRIOR TO FOLLOW UP   If you have labs (blood work) drawn today and your tests are completely normal, you will receive your results only by: [place] (if you have MyChart) OR A paper copy in the mail If you have any lab test that is abnormal or we need to change your treatment, we will call you to review the results.  Testing/Procedures: NONE  Follow-Up: At [facility], you and your health needs are our priority.  As part of our continuing mission to provide you with exceptional heart care, we have created designated Provider Care Teams.  These Care Teams include your primary Cardiologist (physician) and Advanced Practice Providers (APPs -  Physician Assistants and Nurse Practitioners) who all work together to provide you with the care you need, when you need it.  We  recommend signing up for the patient portal called "MyChart".  Sign up information is provided on this After Visit Summary.  MyChart is used to connect with patients for Virtual Visits  (Telemedicine).  Patients are able to view lab/test results, encounter notes, upcoming appointments, etc.  Non-urgent messages can be sent to your provider as well.   To learn more about what you can do with MyChart, go to [email or web address].    Your next appointment:   08/26/2021 AT 9:40 AM  WITH DR [facility] AFTER LABS   Other Instructions Exercise recommendations: The American Heart Association recommends 150 minutes of moderate intensity exercise weekly. Try 30 minutes of moderate intensity exercise 4-5 times per week. This could include walking, jogging, or swimming.    I,Mathew Stumpf,acting as a Education administrator for Skeet Latch, MD.,have documented all relevant documentation on the behalf of Skeet Latch, MD,as directed by  Skeet Latch, MD while in the presence of Skeet Latch, MD.   I, [place] Oval Linsey, MD have reviewed all documentation for this visit.  The documentation of the exam, diagnosis, procedures, and orders on 05/20/2021 are all accurate and complete.   Signed, Skeet Latch, MD  05/20/2021 1:17 PM    [facility]

## 2021-05-20 NOTE — Assessment & Plan Note (Signed)
Noted on CT.  Start rosuvastatin 20 mg.  LDL goal is less than 70.

## 2021-05-20 NOTE — Assessment & Plan Note (Signed)
Blood pressure remains poorly controlled both initially and on repeat.  She has made some dietary changes.  We encouraged her to start exercising at least 150 minutes weekly.  We will increase HCTZ to 25 mg.  She will track her blood pressures and bring back to follow-up.  BMP in a week.

## 2021-05-30 ENCOUNTER — Other Ambulatory Visit: Payer: Self-pay | Admitting: Internal Medicine

## 2021-05-30 DIAGNOSIS — I10 Essential (primary) hypertension: Secondary | ICD-10-CM

## 2021-05-30 NOTE — Progress Notes (Signed)
Will order BMP as f/u to titrated dose fo HCTZ 25mg  daily as per Cardiology. Pt has yet to start higher dose. She will start tomorrow and she will rto next week for lab check.   Plan to change to ARB at next visit.   Should not be billed as ofc visit.

## 2021-06-07 ENCOUNTER — Other Ambulatory Visit: Payer: No Typology Code available for payment source

## 2021-06-08 ENCOUNTER — Other Ambulatory Visit: Payer: No Typology Code available for payment source

## 2021-06-08 ENCOUNTER — Other Ambulatory Visit: Payer: Self-pay

## 2021-06-09 LAB — BASIC METABOLIC PANEL
BUN/Creatinine Ratio: 15 (ref 9–23)
BUN: 10 mg/dL (ref 6–24)
CO2: 30 mmol/L — ABNORMAL HIGH (ref 20–29)
Calcium: 9.8 mg/dL (ref 8.7–10.2)
Chloride: 96 mmol/L (ref 96–106)
Creatinine, Ser: 0.67 mg/dL (ref 0.57–1.00)
Glucose: 98 mg/dL (ref 70–99)
Potassium: 4.1 mmol/L (ref 3.5–5.2)
Sodium: 141 mmol/L (ref 134–144)
eGFR: 103 mL/min/{1.73_m2} (ref >59)

## 2021-06-20 ENCOUNTER — Ambulatory Visit (INDEPENDENT_AMBULATORY_CARE_PROVIDER_SITE_OTHER): Payer: No Typology Code available for payment source | Admitting: Nurse Practitioner

## 2021-06-20 ENCOUNTER — Other Ambulatory Visit: Payer: Self-pay

## 2021-06-20 ENCOUNTER — Other Ambulatory Visit (HOSPITAL_COMMUNITY): Payer: Self-pay

## 2021-06-20 ENCOUNTER — Encounter: Payer: Self-pay | Admitting: Nurse Practitioner

## 2021-06-20 VITALS — BP 124/76 | HR 73 | Temp 98.5°F | Ht 64.0 in | Wt 185.0 lb

## 2021-06-20 DIAGNOSIS — H5789 Other specified disorders of eye and adnexa: Secondary | ICD-10-CM

## 2021-06-20 MED ORDER — BACITRACIN-POLYMYXIN B 500-10000 UNIT/GM OP OINT
TOPICAL_OINTMENT | Freq: Two times a day (BID) | OPHTHALMIC | 0 refills | Status: DC
  Filled 2021-06-20: qty 3.5, 10d supply, fill #0

## 2021-06-20 NOTE — Progress Notes (Signed)
I,[name],acting as a Education administrator for Minette Brine, FNP.,have documented all relevant documentation on the behalf of Minette Brine, FNP,as directed by  Minette Brine, FNP while in the presence of Minette Brine, [place].  This visit occurred during the SARS-CoV-2 public health emergency.  Safety protocols were in place, including screening questions prior to the visit, additional usage of staff PPE, and extensive cleaning of exam room while observing appropriate contact time as indicated for disinfecting solutions.  Subjective:     Patient ID: Sandra Sims , female    DOB: October 15, 1965 , 56 y.o.   MRN: [id number]   Chief Complaint  Patient presents with   Conjunctivitis    HPI  Pt presents today for possible pink eye. She reports no irritation, appears red. Noticed last night had redness to both eyes in the same spot. Mild itchiness. She had crust to her right eye and a haze to the left eye. Denies pain.  No changes in vision.  Conjunctivitis  The current episode started 2 days ago. The onset was sudden. The problem occurs continuously. The problem has been unchanged. Associated symptoms include eye discharge (right eye had crusting). Pertinent negatives include no fever, no decreased vision, no double vision and no eye pain. There were no sick contacts.    Past Medical History:  Diagnosis Date   Atherosclerosis of aorta ([place]) 05/20/2021   CAD in native artery 05/20/2021   Hypertension    PMB (postmenopausal bleeding)    Sickle cell trait (H[facility])    Thickened endometrium    Type 2 diabetes mellitus ([place])    followed by pcp   Uterine fibroid    Wears glasses      Family History  Problem Relation Age of Onset   Hypertension Mother    Colon polyps Mother    Stroke Father    Colon cancer Neg Hx    Esophageal cancer Neg Hx    Rectal cancer Neg Hx    Stomach cancer Neg Hx    Liver disease Neg Hx    Pancreatic cancer Neg Hx    Prostate cancer Neg Hx      Current Outpatient  Medications:    bacitracin-polymyxin b (POLYSPORIN) ophthalmic ointment, Place into both eyes 2 (two) times daily for 10 days. Place a 1/2 inch ribbon of ointment into the lower eyelid., Disp: 3.5 g, Rfl: 0   cholecalciferol (VITAMIN D) 1000 units tablet, Take 1,000 Units by mouth daily., Disp: , Rfl:    cyclobenzaprine (FLEXERIL) 10 MG tablet, Take 1 tablet (10 mg total) by mouth at bedtime as needed., Disp: 30 tablet, Rfl: 0   hydrochlorothiazide (HYDRODIURIL) 25 MG tablet, Take 1 tablet (25 mg total) by mouth daily., Disp: 90 tablet, Rfl: 3   ibuprofen (ADVIL,MOTRIN) 800 MG tablet, Take 800 mg by mouth every 8 (eight) hours as needed., Disp: , Rfl:    magnesium oxide (MAG-OX) 400 MG tablet, Take 400 mg by mouth as needed. , Disp: , Rfl:    mometasone (NASONEX) 50 MCG/ACT nasal spray, Place 2 sprays into the nose daily as needed. , Disp: , Rfl: 2   NON FORMULARY, Sea moss, Disp: , Rfl:    Omega-3 Fatty Acids (FISH OIL PO), Take 1,000 mg by mouth daily. , Disp: , Rfl:    omeprazole (PRILOSEC) 40 MG capsule, SMARTSIG:1 Capsule(s) By Mouth Every Evening, Disp: , Rfl:    OVER THE COUNTER MEDICATION, Apple cider vinegar 2 tsp daily, Disp: , Rfl:    rosuvastatin (  CRESTOR) 20 MG tablet, Take 1 tablet (20 mg total) by mouth daily., Disp: 90 tablet, Rfl: 3   Semaglutide,0.25 or 0.5MG /DOS, (OZEMPIC, 0.25 OR 0.5 MG/DOSE,) 2 MG/1.5ML SOPN, Inject 0.5 mg into the skin once a week., Disp: 1.5 mL, Rfl: 2   vitamin C (ASCORBIC ACID) 500 MG tablet, Take 500 mg by mouth as needed. , Disp: , Rfl:    No Known Allergies   Review of Systems  Constitutional: Negative.  Negative for fever.  Eyes:  Positive for discharge (right eye had crusting). Negative for double vision and pain.  Respiratory: Negative.    Cardiovascular: Negative.   Neurological: Negative.   Psychiatric/Behavioral: Negative.      Today's Vitals   06/20/21 1000  BP: 124/76  Pulse: 73  Temp: 98.5 F (36.9 C)  Weight: 185 lb (83.9 kg)   Height: 5\' 4"  (1.626 m)   Body mass index is 31.76 kg/m.  Wt Readings from Last 3 Encounters:  06/20/21 185 lb (83.9 kg)  05/20/21 188 lb 1.6 oz (85.3 kg)  03/01/21 185 lb (83.9 kg)    Objective:  Physical Exam Vitals reviewed.  Constitutional:      General: She is not in acute distress.    Appearance: Normal appearance.  Eyes:     General:        Right eye: No discharge.        Left eye: No discharge.     Extraocular Movements: Extraocular movements intact.     Pupils: Pupils are equal, round, and reactive to light.      Comments: Erythema noted bilateral eyes  Pulmonary:     Effort: Pulmonary effort is normal. No respiratory distress.  Neurological:     General: No focal deficit present.     Mental Status: She is alert and oriented to person, place, and time.     Cranial Nerves: No cranial nerve deficit.     Motor: No weakness.  Psychiatric:        Mood and Affect: Mood normal.        Behavior: Behavior normal.        Thought Content: Thought content normal.        Judgment: Judgment normal.        Assessment And Plan:     1. Red eyes Comments: No pain, erythema noted to bilateral eyes and with the sudden onset and mild discharge reported will treat for possible conjunctivitis. If symptoms worsen make aware - bacitracin-polymyxin b (POLYSPORIN) ophthalmic ointment; Place into both eyes 2 (two) times daily for 10 days. Place a 1/2 inch ribbon of ointment into the lower eyelid.  Dispense: 3.5 g; Refill: 0     Patient was given opportunity to ask questions. Patient verbalized understanding of the plan and was able to repeat key elements of the plan. All questions were answered to their satisfaction.  Minette Brine, FNP   I, Minette Brine, FNP, have reviewed all documentation for this visit. The documentation on 06/20/21 for the exam, diagnosis, procedures, and orders are all accurate and complete.   IF YOU HAVE BEEN REFERRED TO A SPECIALIST, IT MAY TAKE 1-2 WEEKS TO  SCHEDULE/PROCESS THE REFERRAL. IF YOU HAVE NOT HEARD FROM US/SPECIALIST IN TWO WEEKS, PLEASE GIVE [place] A CALL AT [phone].   THE PATIENT IS ENCOURAGED TO PRACTICE SOCIAL DISTANCING DUE TO THE COVID-19 PANDEMIC.

## 2021-07-29 LAB — HM DIABETES EYE EXAM

## 2021-08-04 ENCOUNTER — Other Ambulatory Visit: Payer: Self-pay | Admitting: Nurse Practitioner

## 2021-08-04 ENCOUNTER — Other Ambulatory Visit (HOSPITAL_COMMUNITY): Payer: Self-pay

## 2021-08-04 DIAGNOSIS — H5789 Other specified disorders of eye and adnexa: Secondary | ICD-10-CM

## 2021-08-04 MED ORDER — BACITRACIN-POLYMYXIN B 500-10000 UNIT/GM OP OINT
TOPICAL_OINTMENT | Freq: Two times a day (BID) | OPHTHALMIC | 0 refills | Status: AC
  Filled 2021-08-04: qty 3.5, 10d supply, fill #0

## 2021-08-18 ENCOUNTER — Other Ambulatory Visit: Payer: No Typology Code available for payment source

## 2021-08-19 LAB — LIPID PANEL
Chol/HDL Ratio: 2.3 ratio (ref 0.0–4.4)
Cholesterol, Total: 141 mg/dL (ref 100–199)
HDL: 61 mg/dL (ref >39)
LDL Chol Calc (NIH): 64 mg/dL (ref 0–99)
Triglycerides: 85 mg/dL (ref 0–149)
VLDL Cholesterol Cal: 16 mg/dL (ref 5–40)

## 2021-08-19 LAB — COMPREHENSIVE METABOLIC PANEL
ALT: 18 IU/L (ref 0–32)
AST: 15 IU/L (ref 0–40)
Albumin/Globulin Ratio: 1.6 (ref 1.2–2.2)
Albumin: 4.3 g/dL (ref 3.8–4.9)
Alkaline Phosphatase: 77 IU/L (ref 44–121)
BUN/Creatinine Ratio: 13 (ref 9–23)
BUN: 9 mg/dL (ref 6–24)
Bilirubin Total: 0.7 mg/dL (ref 0.0–1.2)
CO2: 29 mmol/L (ref 20–29)
Calcium: 9.5 mg/dL (ref 8.7–10.2)
Chloride: 99 mmol/L (ref 96–106)
Creatinine, Ser: 0.71 mg/dL (ref 0.57–1.00)
Globulin, Total: 2.7 g/dL (ref 1.5–4.5)
Glucose: 126 mg/dL — ABNORMAL HIGH (ref 70–99)
Potassium: 4 mmol/L (ref 3.5–5.2)
Sodium: 141 mmol/L (ref 134–144)
Total Protein: 7 g/dL (ref 6.0–8.5)
eGFR: 100 mL/min/{1.73_m2} (ref >59)

## 2021-08-26 ENCOUNTER — Ambulatory Visit (HOSPITAL_BASED_OUTPATIENT_CLINIC_OR_DEPARTMENT_OTHER): Payer: No Typology Code available for payment source | Admitting: Cardiovascular Disease

## 2021-08-26 ENCOUNTER — Encounter (HOSPITAL_BASED_OUTPATIENT_CLINIC_OR_DEPARTMENT_OTHER): Payer: Self-pay | Admitting: Cardiovascular Disease

## 2021-08-26 DIAGNOSIS — E7800 Pure hypercholesterolemia, unspecified: Secondary | ICD-10-CM | POA: Diagnosis not present

## 2021-08-26 DIAGNOSIS — I2510 Atherosclerotic heart disease of native coronary artery without angina pectoris: Secondary | ICD-10-CM

## 2021-08-26 DIAGNOSIS — I10 Essential (primary) hypertension: Secondary | ICD-10-CM | POA: Diagnosis not present

## 2021-08-26 NOTE — Assessment & Plan Note (Signed)
Blood pressure well-controlled.  Continue HCTZ.  Keep up the exercise.  ?

## 2021-08-26 NOTE — Assessment & Plan Note (Signed)
Calcium score was 0.4, 80th percentile.  She is doing well on rosuvastatin.  LDL goal <70. ?

## 2021-08-26 NOTE — Patient Instructions (Signed)
Medication Instructions:  ?Continue current medication ? ?*If you need a refill on your cardiac medications before your next appointment, please call your pharmacy* ? ? ?Lab Work: ?None Ordered ? ? ?Testing/Procedures: ?None Ordered ? ? ?Follow-Up: ?At [facility], you and your health needs are our priority.  As part of our continuing mission to provide you with exceptional heart care, we have created designated Provider Care Teams.  These Care Teams include your primary Cardiologist (physician) and Advanced Practice Providers (APPs -  Physician Assistants and Nurse Practitioners) who all work together to provide you with the care you need, when you need it. ? ?We recommend signing up for the patient portal called "MyChart".  Sign up information is provided on this After Visit Summary.  MyChart is used to connect with patients for Virtual Visits (Telemedicine).  Patients are able to view lab/test results, encounter notes, upcoming appointments, etc.  Non-urgent messages can be sent to your provider as well.   ?To learn more about what you can do with MyChart, go to [email or web address].   ? ?Your next appointment:   ?1 year(s) ? ?The format for your next appointment:   ?In Person ? ?Provider:   ?Skeet Latch, MD  ? ? ? ? ?Important Information About Sugar ? ? ? ? ? ? ?

## 2021-08-26 NOTE — Progress Notes (Signed)
? ?Cardiology Office Note:   ? ?Date:  08/26/2021  ? ?ID:  Sandra Sims, DOB [birth year], MRN [id number] ? ?PCP:  Glendale Chard, MD ?  ?[facility] Providers ?Cardiologist:  None    ? ?Referring MD: Glendale Chard, MD  ? ?No chief complaint on file. ? ?History of Present Illness:   ? ?Sandra Sims is a 56 y.o. female with a hx of hyperlipidemia, diabetes mellitus type 2, and hypertension, here for follow-up.  She was initially seen 01/2021 for hyperlipidemia management and counseling for cardiac risk prevention.  At the time she was feeling well but had room for improvement with lifestyle measures.  We discussed increasing her exercise and limiting sodium intake with plans to reassess her blood pressure at follow-up.  Her ASCVD 10-year risk or was 5.5%.  She got a coronary calcium score which revealed a score of 0.4, however it was 80th percentile for age and gender.  She also had a small amount of atherosclerosis in the aortic arch. ? ?At her last appointment her BP remained above goal despite lifestyle changes.  HCTZ was increased to '25mg'$ .  She was started on rosuvastatin due to elevated Ca score.  She has tolerated this well.  She is walking almost daily and has no exertional chest pain or shortness of breath.  She denies LE edema, orthopnea or PND.  Her BP has been well-controlled.  Lipids are now at goal.  ? ?Past Medical History:  ?Diagnosis Date  ? Atherosclerosis of aorta ([place]) 05/20/2021  ? CAD in native artery 05/20/2021  ? Hypertension   ? PMB (postmenopausal bleeding)   ? Sickle cell trait ([place])   ? Thickened endometrium   ? Type 2 diabetes mellitus ([place])   ? followed by pcp  ? Uterine fibroid   ? Wears glasses   ? ? ?Past Surgical History:  ?Procedure Laterality Date  ? DILATATION & CURETTAGE/HYSTEROSCOPY WITH MYOSURE N/A 12/10/2018  ? Procedure: [place];  Surgeon: Servando Salina, MD;  [facility];  Service: Gynecology;   Laterality: N/A;  ? DILATION AND CURETTAGE OF UTERUS  yrs ago  ? IR GENERIC HISTORICAL  06/22/2016  ? IR RADIOLOGIST EVAL & MGMT 06/22/2016 [facility] INTERV RAD  ? TUBAL LIGATION Bilateral yrs ago  ? ? ?Current Medications: ?Current Meds  ?Medication Sig  ? cholecalciferol (VITAMIN D) 1000 units tablet Take 1,000 Units by mouth daily.  ? cyclobenzaprine (FLEXERIL) 10 MG tablet Take 1 tablet (10 mg total) by mouth at bedtime as needed.  ? hydrochlorothiazide (HYDRODIURIL) 25 MG tablet Take 1 tablet (25 mg total) by mouth daily.  ? ibuprofen (ADVIL,MOTRIN) 800 MG tablet Take 800 mg by mouth every 8 (eight) hours as needed.  ? magnesium oxide (MAG-OX) 400 MG tablet Take 400 mg by mouth as needed.   ? mometasone (NASONEX) 50 MCG/ACT nasal spray Place 2 sprays into the nose daily as needed.   ? NON FORMULARY Sea moss  ? Omega-3 Fatty Acids (FISH OIL PO) Take 1,000 mg by mouth daily.   ? omeprazole (PRILOSEC) 40 MG capsule SMARTSIG:1 Capsule(s) By Mouth Every Evening  ? OVER THE COUNTER MEDICATION Apple cider vinegar 2 tsp daily  ? rosuvastatin (CRESTOR) 20 MG tablet Take 1 tablet (20 mg total) by mouth daily.  ? Semaglutide,0.25 or 0.'5MG'$ /DOS, (OZEMPIC, 0.25 OR 0.5 MG/DOSE,) 2 MG/1.5ML SOPN Inject 0.5 mg into the skin once a week.  ? vitamin C (ASCORBIC ACID) 500 MG tablet Take 500 mg  by mouth as needed.   ?  ? ?Allergies:   Patient has no known allergies.  ? ?Social History  ? ?Socioeconomic History  ? Marital status: Married  ?  Spouse name: Not on file  ? Number of children: Not on file  ? Years of education: Not on file  ? Highest education level: Not on file  ?Occupational History  ? Not on file  ?Tobacco Use  ? Smoking status: Former  ?  Packs/day: 0.50  ?  Years: 11.00  ?  Pack years: 5.50  ?  Types: Cigarettes  ?  Quit date: 02/06/1995  ?  Years since quitting: 26.5  ? Smokeless tobacco: Never  ?Vaping Use  ? Vaping Use: Never used  ?Substance and Sexual Activity  ? Alcohol use: No  ? Drug use: Never  ? Sexual activity:  Yes  ?  Birth control/protection: Surgical  ?Other Topics Concern  ? Not on file  ?Social History Narrative  ? Not on file  ? ?Social Determinants of Health  ? ?Financial Resource Strain: Not on file  ?Food Insecurity: Not on file  ?Transportation Needs: Not on file  ?Physical Activity: Not on file  ?Stress: Not on file  ?Social Connections: Not on file  ?  ? ?Family History: ?The patient's family history includes Colon polyps in her mother; Hypertension in her mother; Stroke in her father. There is no history of Colon cancer, Esophageal cancer, Rectal cancer, Stomach cancer, Liver disease, Pancreatic cancer, or Prostate cancer. ? ?ROS:   ?Please see the history of present illness.    ?All other systems reviewed and are negative. ? ?EKGs/Labs/Other Studies Reviewed:   ? ?The following studies were reviewed today: ?No prior CV studies available. ? ?EKG:    ?02/03/2021: EKG is not ordered today. ? ?Coronary calcium score 05/13/21: ?IMPRESSION: ?1. Coronary calcium score of 0.4. This was 79 percentile for age-, ?race-, and sex-matched controls. ?  ?2.  Small focal atherosclerosis of aortic arch. ? ?Recent Labs: ?10/12/2020: Hemoglobin 13.3; Platelets 280 ?08/18/2021: ALT 18; BUN 9; Creatinine, Ser 0.71; Potassium 4.0; Sodium 141  ? ?Recent Lipid Panel ?   ?Component Value Date/Time  ? CHOL 141 08/18/2021 0852  ? TRIG 85 08/18/2021 0852  ? HDL 61 08/18/2021 0852  ? CHOLHDL 2.3 08/18/2021 0852  ? [place] 64 08/18/2021 0852  ? ?    ? ?Physical Exam:   ? ?Wt Readings from Last 3 Encounters:  ?08/26/21 186 lb 6.4 oz (84.6 kg)  ?06/20/21 185 lb (83.9 kg)  ?05/20/21 188 lb 1.6 oz (85.3 kg)  ?  ? ?VS:  BP 114/82 (BP Location: Left Arm, Patient Position: Sitting, Cuff Size: Large)   Pulse 68   Ht '5\' 4"'$  (1.626 m)   Wt 186 lb 6.4 oz (84.6 kg)   LMP  (LMP Unknown)   SpO2 97%   BMI 32.00 kg/m?  , BMI Body mass index is 32 kg/m?. ?GENERAL:  Well appearing ?HEENT: Pupils equal round and reactive, fundi not visualized, oral mucosa  unremarkable ?NECK:  No jugular venous distention, waveform within normal limits, carotid upstroke brisk and symmetric, no bruits, no thyromegaly ?LUNGS:  Clear to auscultation bilaterally ?HEART:  RRR.  PMI not displaced or sustained,S1 and S2 within normal limits, no S3, no S4, no clicks, no rubs, no murmurs ?ABD:  Flat, positive bowel sounds normal in frequency in pitch, no bruits, no rebound, no guarding, no midline pulsatile mass, no hepatomegaly, no splenomegaly ?EXT:  2 plus pulses throughout,  no edema, no cyanosis no clubbing ?SKIN:  No rashes no nodules ?NEURO:  Cranial nerves II through XII grossly intact, motor grossly intact throughout ?PSYCH:  Cognitively intact, oriented to person place and time ? ? ? ?ASSESSMENT:   ? ?1. CAD in native artery   ?2. Essential hypertension, benign   ?3. Pure hypercholesterolemia   ? ? ? ?PLAN:   ? ?CAD in native artery ?Calcium score was 0.4, 80th percentile.  She is doing well on rosuvastatin.  LDL goal <70. ? ?Essential hypertension, benign ?Blood pressure well-controlled.  Continue HCTZ.  Keep up the exercise.  ? ?Pure hypercholesterolemia ?She is doing well on rosuvastatin.  LDL goal <70. ? ? ?Disposition: ?FU with Secret Kristensen C. Oval Linsey, MD, [facility] in 1 year ? ?Medication Adjustments/Labs and Tests Ordered: ?Current medicines are reviewed at length with the patient today.  Concerns regarding medicines are outlined above.  ? ?No orders of the defined types were placed in this encounter. ? ? ?No orders of the defined types were placed in this encounter. ? ? ?Patient Instructions  ?Medication Instructions:  ?Continue current medication ? ?*If you need a refill on your cardiac medications before your next appointment, please call your pharmacy* ? ? ?Lab Work: ?None Ordered ? ? ?Testing/Procedures: ?None Ordered ? ? ?Follow-Up: ?At [facility], you and your health needs are our priority.  As part of our continuing mission to provide you with exceptional heart care, we have  created designated Provider Care Teams.  These Care Teams include your primary Cardiologist (physician) and Advanced Practice Providers (APPs -  Physician Assistants and Nurse Practitioners) who all work together

## 2021-08-26 NOTE — Assessment & Plan Note (Signed)
She is doing well on rosuvastatin.  LDL goal <70. ?

## 2021-09-09 ENCOUNTER — Other Ambulatory Visit (HOSPITAL_COMMUNITY): Payer: Self-pay

## 2021-10-20 ENCOUNTER — Encounter: Payer: Self-pay | Admitting: Internal Medicine

## 2021-10-20 ENCOUNTER — Ambulatory Visit (INDEPENDENT_AMBULATORY_CARE_PROVIDER_SITE_OTHER): Payer: No Typology Code available for payment source | Admitting: Internal Medicine

## 2021-10-20 VITALS — BP 122/68 | HR 75 | Temp 98.3°F | Ht 64.0 in | Wt 183.0 lb

## 2021-10-20 DIAGNOSIS — I10 Essential (primary) hypertension: Secondary | ICD-10-CM

## 2021-10-20 DIAGNOSIS — E785 Hyperlipidemia, unspecified: Secondary | ICD-10-CM | POA: Diagnosis not present

## 2021-10-20 DIAGNOSIS — Z0000 Encounter for general adult medical examination without abnormal findings: Secondary | ICD-10-CM

## 2021-10-20 DIAGNOSIS — E1169 Type 2 diabetes mellitus with other specified complication: Secondary | ICD-10-CM

## 2021-10-20 DIAGNOSIS — D573 Sickle-cell trait: Secondary | ICD-10-CM | POA: Insufficient documentation

## 2021-10-20 DIAGNOSIS — E6609 Other obesity due to excess calories: Secondary | ICD-10-CM

## 2021-10-20 DIAGNOSIS — Z6831 Body mass index (BMI) 31.0-31.9, adult: Secondary | ICD-10-CM

## 2021-10-20 LAB — POCT URINALYSIS DIPSTICK
Bilirubin, UA: NEGATIVE
Blood, UA: NEGATIVE
Glucose, UA: NEGATIVE
Ketones, UA: NEGATIVE
Leukocytes, UA: NEGATIVE
Nitrite, UA: NEGATIVE
Protein, UA: NEGATIVE
Spec Grav, UA: 1.02 (ref 1.010–1.025)
Urobilinogen, UA: 0.2 E.U./dL
pH, UA: 7 (ref 5.0–8.0)

## 2021-10-20 NOTE — Patient Instructions (Signed)

## 2021-10-20 NOTE — Progress Notes (Unsigned)
This visit occurred during the SARS-CoV-2 public health emergency.  Safety protocols were in place, including screening questions prior to the visit, additional usage of staff PPE, and extensive cleaning of exam room while observing appropriate contact time as indicated for disinfecting solutions.  Subjective:     Patient ID: Sandra Sims , female    DOB: 1965/05/21 , 56 y.o.   MRN: [id number]   Chief Complaint  Patient presents with   Annual Exam    HPI  She is here today for a full physical examination.  She is followed by Dr. Garwin Brothers for her Gyn care.  She reports compliance with meds. She denies headaches, chest pain and shortness of breath.  She is happy to report she is exercising on a regular basis. She has no specific concerns at this time.   Diabetes She presents for her follow-up diabetic visit. She has type 2 diabetes mellitus. Pertinent negatives for diabetes include no blurred vision. There are no hypoglycemic complications. Risk factors for coronary artery disease include diabetes mellitus, dyslipidemia, hypertension, obesity and post-menopausal. She is compliant with treatment most of the time. She is following a diabetic diet. She participates in exercise three times a week. An ACE inhibitor/angiotensin II receptor blocker is not being taken.  Hypertension This is a chronic problem. The current episode started Sims than 1 year ago. The problem has been gradually improving since onset. The problem is controlled. Pertinent negatives include no blurred vision. The current treatment provides moderate improvement. There are no compliance problems.     Past Medical History:  Diagnosis Date   Atherosclerosis of aorta ([place]) 05/20/2021   CAD in native artery 05/20/2021   Hypertension    PMB (postmenopausal bleeding)    Sickle cell trait (HCC)    Thickened endometrium    Type 2 diabetes mellitus ([place])    followed by pcp   Uterine fibroid    Wears glasses      Family  History  Problem Relation Age of Onset   Hypertension Mother    Colon polyps Mother    Stroke Father    Colon cancer Neg Hx    Esophageal cancer Neg Hx    Rectal cancer Neg Hx    Stomach cancer Neg Hx    Liver disease Neg Hx    Pancreatic cancer Neg Hx    Prostate cancer Neg Hx      Current Outpatient Medications:    cholecalciferol (VITAMIN D) 1000 units tablet, Take 1,000 Units by mouth daily., Disp: , Rfl:    cyclobenzaprine (FLEXERIL) 10 MG tablet, Take 1 tablet (10 mg total) by mouth at bedtime as needed., Disp: 30 tablet, Rfl: 0   hydrochlorothiazide (HYDRODIURIL) 25 MG tablet, Take 1 tablet (25 mg total) by mouth daily., Disp: 90 tablet, Rfl: 3   ibuprofen (ADVIL,MOTRIN) 800 MG tablet, Take 800 mg by mouth every 8 (eight) hours as needed., Disp: , Rfl:    magnesium oxide (MAG-OX) 400 MG tablet, Take 400 mg by mouth as needed. , Disp: , Rfl:    mometasone (NASONEX) 50 MCG/ACT nasal spray, Place 2 sprays into the nose daily as needed. , Disp: , Rfl: 2   NON FORMULARY, Sea moss, Disp: , Rfl:    Omega-3 Fatty Acids (FISH OIL PO), Take 1,000 mg by mouth daily. , Disp: , Rfl:    OVER THE COUNTER MEDICATION, Apple cider vinegar 2 tsp daily, Disp: , Rfl:    rosuvastatin (CRESTOR) 20 MG tablet, Take 1 tablet (20  mg total) by mouth daily., Disp: 90 tablet, Rfl: 3   Semaglutide,0.25 or 0.'5MG'$ /DOS, (OZEMPIC, 0.25 OR 0.5 MG/DOSE,) 2 MG/1.5ML SOPN, Inject 0.5 mg into the skin once a week., Disp: 1.5 mL, Rfl: 2   vitamin C (ASCORBIC ACID) 500 MG tablet, Take 500 mg by mouth as needed. , Disp: , Rfl:    No Known Allergies    The patient states she uses post menopausal status for birth control. Last LMP was No LMP recorded (lmp unknown). Patient is postmenopausal.. Negative for Dysmenorrhea. Negative for: breast discharge, breast lump(s), breast pain and breast self exam. Associated symptoms include abnormal vaginal bleeding. Pertinent negatives include abnormal bleeding (hematology), anxiety,  decreased libido, depression, difficulty falling sleep, dyspareunia, history of infertility, nocturia, sexual dysfunction, sleep disturbances, urinary incontinence, urinary urgency, vaginal discharge and vaginal itching. Diet regular.The patient states her exercise level is  moderate.   . The patient's tobacco use is:  Social History   Tobacco Use  Smoking Status Former   Packs/day: 0.50   Years: 11.00   Total pack years: 5.50   Types: Cigarettes   Quit date: 02/06/1995   Years since quitting: 26.7  Smokeless Tobacco Never  . She has been exposed to passive smoke. The patient's alcohol use is:  Social History   Substance and Sexual Activity  Alcohol Use No    Review of Systems  Constitutional: Negative.   HENT: Negative.    Eyes: Negative.  Negative for blurred vision.  Respiratory: Negative.    Cardiovascular: Negative.   Gastrointestinal: Negative.   Endocrine: Negative.   Genitourinary: Negative.   Musculoskeletal: Negative.   Skin: Negative.   Allergic/Immunologic: Negative.   Neurological: Negative.   Hematological: Negative.      Today's Vitals   10/20/21 0955  BP: 122/68  Pulse: 75  Temp: 98.3 F (36.8 C)  Weight: 183 lb (83 kg)  Height: '5\' 4"'$  (1.626 m)  PainSc: 0-No pain   Body mass index is 31.41 kg/m.   Wt Readings from Last 3 Encounters:  10/20/21 183 lb (83 kg)  08/26/21 186 lb 6.4 oz (84.6 kg)  06/20/21 185 lb (83.9 kg)     Objective:  Physical Exam Vitals and nursing note reviewed.  Constitutional:      Appearance: Normal appearance.  HENT:     Head: Normocephalic and atraumatic.     Right Ear: Tympanic membrane, ear canal and external ear normal.     Left Ear: Tympanic membrane, ear canal and external ear normal.     Nose: Nose normal.     Mouth/Throat:     Mouth: Mucous membranes are moist.     Pharynx: Oropharynx is clear.  Eyes:     Extraocular Movements: Extraocular movements intact.     Conjunctiva/sclera: Conjunctivae normal.      Pupils: Pupils are equal, round, and reactive to light.  Cardiovascular:     Rate and Rhythm: Normal rate and regular rhythm.     Pulses: Normal pulses.          Dorsalis pedis pulses are 2+ on the right side and 2+ on the left side.       Posterior tibial pulses are 2+ on the right side and 2+ on the left side.     Heart sounds: Normal heart sounds.  Pulmonary:     Effort: Pulmonary effort is normal.     Breath sounds: Normal breath sounds.  Chest:  Breasts:    Tanner Score is 5.  Right: Normal.     Left: Normal.     Comments: Hyperpigmentation b/l beneath breasts Abdominal:     General: Bowel sounds are normal.     Palpations: Abdomen is soft.  Genitourinary:    Comments: deferred Musculoskeletal:        General: Normal range of motion.     Cervical back: Normal range of motion and neck supple.  Feet:     Right foot:     Protective Sensation: 5 sites tested.  5 sites sensed.     Skin integrity: Skin integrity normal.     Toenail Condition: Right toenails are normal.     Left foot:     Protective Sensation: 5 sites tested.  5 sites sensed.     Skin integrity: Skin integrity normal.     Toenail Condition: Left toenails are normal.  Skin:    General: Skin is warm and dry.  Neurological:     General: No focal deficit present.     Mental Status: She is alert and oriented to person, place, and time.  Psychiatric:        Mood and Affect: Mood normal.        Behavior: Behavior normal.      Assessment And Plan:     1. Routine general medical examination at health care facility Comments: A full exam was performed. Importance of monthly self breast exams was discussed with the patient. PATIENT IS ADVISED TO GET 30-45 MINUTES REGULAR EXERCISE NO LESS THAN FOUR TO FIVE DAYS PER WEEK - BOTH WEIGHTBEARING EXERCISES AND AEROBIC ARE RECOMMENDED.  PATIENT IS ADVISED TO FOLLOW A HEALTHY DIET WITH AT LEAST SIX FRUITS/VEGGIES PER DAY, DECREASE INTAKE OF RED MEAT, AND TO INCREASE  FISH INTAKE TO TWO DAYS PER WEEK.  MEATS/FISH SHOULD NOT BE FRIED, BAKED OR BROILED IS PREFERABLE.  IT IS ALSO IMPORTANT TO CUT BACK ON YOUR SUGAR INTAKE. PLEASE AVOID ANYTHING WITH ADDED SUGAR, CORN SYRUP OR OTHER SWEETENERS. IF YOU MUST USE A SWEETENER, YOU CAN TRY STEVIA. IT IS ALSO IMPORTANT TO AVOID ARTIFICIALLY SWEETENERS AND DIET BEVERAGES. LASTLY, I SUGGEST WEARING SPF 50 SUNSCREEN ON EXPOSED PARTS AND ESPECIALLY WHEN IN THE DIRECT SUNLIGHT FOR AN EXTENDED PERIOD OF TIME.  PLEASE AVOID FAST FOOD RESTAURANTS AND INCREASE YOUR WATER INTAKE. - CBC - Hemoglobin A1c  2. Dyslipidemia associated with type 2 diabetes mellitus ([place]) Comments: Diabetic foot exam was performed. She will rto in 14month for re-evaluation. LDL goal <70, recent labs at goal. I DISCUSSED WITH THE PATIENT AT LENGTH REGARDING THE GOALS OF GLYCEMIC CONTROL AND POSSIBLE LONG-TERM COMPLICATIONS.  I  ALSO STRESSED THE IMPORTANCE OF COMPLIANCE WITH HOME GLUCOSE MONITORING, DIETARY RESTRICTIONS INCLUDING AVOIDANCE OF SUGARY DRINKS/PROCESSED FOODS,  ALONG WITH REGULAR EXERCISE.  I  ALSO STRESSED THE IMPORTANCE OF ANNUAL EYE EXAMS, SELF FOOT CARE AND COMPLIANCE WITH OFFICE VISITS. - Urine microalbumin-creatinine with uACR - POCT Urinalysis Dipstick (81002)  3. Essential hypertension, benign Comments: Chronic, well controlled. EKG performed, NSR w/ nonspecific T abnormality - no new changes.  Advised to follow low sodium diet, f/u in 4 months.  - EKG 12-Lead  4. Sickle cell trait (HCC) Comments: Chronic, stable. She is encouraged to stay well hydrated.   5. Class 1 obesity due to excess calories with serious comorbidity and body mass index (BMI) of 31.0 to 31.9 in adult She is encouraged to strive for BMI less than 30 to decrease cardiac risk. Advised to aim for at least 150 minutes of exercise per  week.    Patient was given opportunity to ask questions. Patient verbalized understanding of the plan and was able to repeat key  elements of the plan. All questions were answered to their satisfaction.   I, [name], MD, have reviewed all documentation for this visit. The documentation on 10/20/21 for the exam, diagnosis, procedures, and orders are all accurate and complete.   THE PATIENT IS ENCOURAGED TO PRACTICE SOCIAL DISTANCING DUE TO THE COVID-19 PANDEMIC.

## 2021-10-20 NOTE — Progress Notes (Incomplete)
This visit occurred during the SARS-CoV-2 public health emergency.  Safety protocols were in place, including screening questions prior to the visit, additional usage of staff PPE, and extensive cleaning of exam room while observing appropriate contact time as indicated for disinfecting solutions.  Subjective:     Patient ID: Sandra Sims , female    DOB: 09-27-1965 , 56 y.o.   MRN: [id number]   Chief Complaint  Patient presents with  . Annual Exam    HPI  She is here today for a full physical examination.  She is followed by Dr. Garwin Brothers for her Gyn care.  She reports compliance with meds. She denies headaches, chest pain and shortness of breath.   Diabetes She presents for her follow-up diabetic visit. She has type 2 diabetes mellitus. Her disease course has been stable. Pertinent negatives for diabetes include no blurred vision. There are no hypoglycemic complications. Risk factors for coronary artery disease include diabetes mellitus, dyslipidemia, hypertension, obesity and post-menopausal. She is compliant with treatment most of the time. She is following a diabetic diet. She participates in exercise three times a week. An ACE inhibitor/angiotensin II receptor blocker is not being taken.  Hypertension This is a chronic problem. The current episode started Sims than 1 year ago. The problem has been gradually improving since onset. The problem is controlled. Pertinent negatives include no blurred vision. The current treatment provides moderate improvement. There are no compliance problems.      Past Medical History:  Diagnosis Date  . Atherosclerosis of aorta ([place]) 05/20/2021  . CAD in native artery 05/20/2021  . Hypertension   . PMB (postmenopausal bleeding)   . Sickle cell trait ([place])   . Thickened endometrium   . Type 2 diabetes mellitus ([place])    followed by pcp  . Uterine fibroid   . Wears glasses      Family History  Problem Relation Age of Onset  . Hypertension Mother    . Colon polyps Mother   . Stroke Father   . Colon cancer Neg Hx   . Esophageal cancer Neg Hx   . Rectal cancer Neg Hx   . Stomach cancer Neg Hx   . Liver disease Neg Hx   . Pancreatic cancer Neg Hx   . Prostate cancer Neg Hx      Current Outpatient Medications:  .  cholecalciferol (VITAMIN D) 1000 units tablet, Take 1,000 Units by mouth daily., Disp: , Rfl:  .  cyclobenzaprine (FLEXERIL) 10 MG tablet, Take 1 tablet (10 mg total) by mouth at bedtime as needed., Disp: 30 tablet, Rfl: 0 .  hydrochlorothiazide (HYDRODIURIL) 25 MG tablet, Take 1 tablet (25 mg total) by mouth daily., Disp: 90 tablet, Rfl: 3 .  ibuprofen (ADVIL,MOTRIN) 800 MG tablet, Take 800 mg by mouth every 8 (eight) hours as needed., Disp: , Rfl:  .  magnesium oxide (MAG-OX) 400 MG tablet, Take 400 mg by mouth as needed. , Disp: , Rfl:  .  mometasone (NASONEX) 50 MCG/ACT nasal spray, Place 2 sprays into the nose daily as needed. , Disp: , Rfl: 2 .  NON FORMULARY, Sea moss, Disp: , Rfl:  .  Omega-3 Fatty Acids (FISH OIL PO), Take 1,000 mg by mouth daily. , Disp: , Rfl:  .  omeprazole (PRILOSEC) 40 MG capsule, SMARTSIG:1 Capsule(s) By Mouth Every Evening, Disp: , Rfl:  .  OVER THE COUNTER MEDICATION, Apple cider vinegar 2 tsp daily, Disp: , Rfl:  .  rosuvastatin (CRESTOR) 20 MG tablet, Take  1 tablet (20 mg total) by mouth daily., Disp: 90 tablet, Rfl: 3 .  Semaglutide,0.25 or 0.'5MG'$ /DOS, (OZEMPIC, 0.25 OR 0.5 MG/DOSE,) 2 MG/1.5ML SOPN, Inject 0.5 mg into the skin once a week., Disp: 1.5 mL, Rfl: 2 .  vitamin C (ASCORBIC ACID) 500 MG tablet, Take 500 mg by mouth as needed. , Disp: , Rfl:    No Known Allergies    The patient states she uses {contraceptive methods:5051} for birth control. Last LMP was No LMP recorded (lmp unknown). Patient is postmenopausal.. {Dysmenorrhea-menorrhagia:21918}. Negative for: breast discharge, breast lump(s), breast pain and breast self exam. Associated symptoms include abnormal vaginal  bleeding. Pertinent negatives include abnormal bleeding (hematology), anxiety, decreased libido, depression, difficulty falling sleep, dyspareunia, history of infertility, nocturia, sexual dysfunction, sleep disturbances, urinary incontinence, urinary urgency, vaginal discharge and vaginal itching. Diet regular.The patient states her exercise level is    . The patient's tobacco use is:  Social History   Tobacco Use  Smoking Status Former  . Packs/day: 0.50  . Years: 11.00  . Total pack years: 5.50  . Types: Cigarettes  . Quit date: 02/06/1995  . Years since quitting: 26.7  Smokeless Tobacco Never  . She has been exposed to passive smoke. The patient's alcohol use is:  Social History   Substance and Sexual Activity  Alcohol Use No  . Additional information: Last pap ***, next one scheduled for ***.    Review of Systems  Constitutional: Negative.   HENT: Negative.    Eyes: Negative.  Negative for blurred vision.  Respiratory: Negative.    Cardiovascular: Negative.   Gastrointestinal: Negative.   Endocrine: Negative.   Genitourinary: Negative.   Musculoskeletal: Negative.   Skin: Negative.   Allergic/Immunologic: Negative.   Neurological: Negative.   Hematological: Negative.      There were no vitals filed for this visit. There is no height or weight on file to calculate BMI.   Objective:  Physical Exam      Assessment And Plan:     There are no diagnoses linked to this encounter.    Patient was given opportunity to ask questions. Patient verbalized understanding of the plan and was able to repeat key elements of the plan. All questions were answered to their satisfaction.   Tonie Griffith, [place], Tonie Griffith, CMA, have reviewed all documentation for this visit. The documentation on 10/20/21 for the exam, diagnosis, procedures, and orders are all accurate and complete.  THE PATIENT IS ENCOURAGED TO PRACTICE SOCIAL DISTANCING DUE TO THE COVID-19 PANDEMIC.

## 2021-10-21 LAB — HEMOGLOBIN A1C
Est. average glucose Bld gHb Est-mCnc: 148 mg/dL
Hgb A1c MFr Bld: 6.8 % — ABNORMAL HIGH (ref 4.8–5.6)

## 2021-10-21 LAB — CBC
Hematocrit: 42.9 % (ref 34.0–46.6)
Hemoglobin: 13.8 g/dL (ref 11.1–15.9)
MCH: 25.6 pg — ABNORMAL LOW (ref 26.6–33.0)
MCHC: 32.2 g/dL (ref 31.5–35.7)
MCV: 80 fL (ref 79–97)
Platelets: 340 10*3/uL (ref 150–450)
RBC: 5.39 x10E6/uL — ABNORMAL HIGH (ref 3.77–5.28)
RDW: 13.9 % (ref 11.7–15.4)
WBC: 4.9 10*3/uL (ref 3.4–10.8)

## 2021-10-21 LAB — MICROALBUMIN / CREATININE URINE RATIO
Creatinine, Urine: 103.1 mg/dL
Microalb/Creat Ratio: 5 mg/g creat (ref 0–29)
Microalbumin, Urine: 4.7 ug/mL

## 2021-10-23 ENCOUNTER — Encounter: Payer: Self-pay | Admitting: Internal Medicine

## 2021-12-05 ENCOUNTER — Other Ambulatory Visit (HOSPITAL_COMMUNITY): Payer: Self-pay

## 2022-02-20 ENCOUNTER — Ambulatory Visit: Payer: No Typology Code available for payment source | Admitting: Internal Medicine

## 2022-02-27 ENCOUNTER — Other Ambulatory Visit (HOSPITAL_COMMUNITY): Payer: Self-pay

## 2022-03-03 ENCOUNTER — Ambulatory Visit (INDEPENDENT_AMBULATORY_CARE_PROVIDER_SITE_OTHER): Payer: No Typology Code available for payment source

## 2022-03-03 VITALS — BP 122/88 | HR 76 | Temp 98.1°F | Ht 64.0 in | Wt 183.0 lb

## 2022-03-03 DIAGNOSIS — Z23 Encounter for immunization: Secondary | ICD-10-CM

## 2022-03-03 NOTE — Patient Instructions (Signed)

## 2022-03-03 NOTE — Progress Notes (Signed)
Patient presents today for flu injection.

## 2022-03-29 ENCOUNTER — Ambulatory Visit (INDEPENDENT_AMBULATORY_CARE_PROVIDER_SITE_OTHER): Payer: No Typology Code available for payment source | Admitting: Internal Medicine

## 2022-03-29 ENCOUNTER — Encounter: Payer: Self-pay | Admitting: Internal Medicine

## 2022-03-29 ENCOUNTER — Ambulatory Visit: Payer: No Typology Code available for payment source | Admitting: Internal Medicine

## 2022-03-29 ENCOUNTER — Other Ambulatory Visit (HOSPITAL_COMMUNITY): Payer: Self-pay

## 2022-03-29 VITALS — BP 132/70 | HR 61 | Temp 98.5°F | Ht 64.0 in | Wt 186.0 lb

## 2022-03-29 DIAGNOSIS — E1165 Type 2 diabetes mellitus with hyperglycemia: Secondary | ICD-10-CM | POA: Diagnosis not present

## 2022-03-29 DIAGNOSIS — M25561 Pain in right knee: Secondary | ICD-10-CM | POA: Diagnosis not present

## 2022-03-29 DIAGNOSIS — I10 Essential (primary) hypertension: Secondary | ICD-10-CM

## 2022-03-29 DIAGNOSIS — M25562 Pain in left knee: Secondary | ICD-10-CM | POA: Diagnosis not present

## 2022-03-29 DIAGNOSIS — E559 Vitamin D deficiency, unspecified: Secondary | ICD-10-CM

## 2022-03-29 DIAGNOSIS — W101XXA Fall (on)(from) sidewalk curb, initial encounter: Secondary | ICD-10-CM

## 2022-03-29 LAB — HEMOGLOBIN A1C
Est. average glucose Bld gHb Est-mCnc: 148 mg/dL
Hgb A1c MFr Bld: 6.8 % — ABNORMAL HIGH (ref 4.8–5.6)

## 2022-03-29 MED ORDER — SEMAGLUTIDE (1 MG/DOSE) 4 MG/3ML ~~LOC~~ SOPN
1.0000 mg | PEN_INJECTOR | SUBCUTANEOUS | 3 refills | Status: DC
Start: 2022-03-29 — End: 2022-09-25
  Filled 2022-03-29 – 2022-05-02 (×2): qty 3, 28d supply, fill #0
  Filled 2022-06-27 – 2022-07-03 (×2): qty 3, 28d supply, fill #1
  Filled 2022-07-28: qty 3, 28d supply, fill #2
  Filled 2022-08-28: qty 3, 28d supply, fill #3

## 2022-03-29 NOTE — Progress Notes (Signed)
Barnet Glasgow Martin,acting as a Education administrator for [name], MD.,have documented all relevant documentation on the behalf of [name], MD,as directed by  [name], MD while in the presence of [name], MD.    Subjective:     Patient ID: Sandra Sims , female    DOB: 11/28/1965 , 56 y.o.   MRN: [id number]   Chief Complaint  Patient presents with   Diabetes   Hypertension    HPI  Patient presents today for a DM/BP check.  She reports compliance with medications. She denies having any headaches, chest pain and shortness of breath.    Patient did have a fall outside Monday morning (03/27/22) which led to scrapes on her knees. She states she was getting out of her truck, stepped onto the curb and her foot slipped off of the curb. She fell to the ground onto her knees. She did suffer some abrasions on her knees. She did not seek immediate medical attention since she had today's appt.   BP Readings from Last 3 Encounters: 03/29/22 : 132/70 03/03/22 : 122/88 10/20/21 : 122/68    Diabetes She presents for her follow-up diabetic visit. She has type 2 diabetes mellitus. Her disease course has been stable. Pertinent negatives for diabetes include no blurred vision. There are no hypoglycemic complications. Risk factors for coronary artery disease include diabetes mellitus, dyslipidemia, hypertension, obesity and post-menopausal. She is compliant with treatment most of the time. She is following a diabetic diet. She participates in exercise three times a week. An ACE inhibitor/angiotensin II receptor blocker is not being taken.  Hypertension This is a chronic problem. The current episode started more than 1 year ago. The problem has been gradually improving since onset. The problem is controlled. Pertinent negatives include no blurred vision. The current treatment provides moderate improvement. There are no compliance problems.      Past Medical History:  Diagnosis Date    Atherosclerosis of aorta ([place]) 05/20/2021   CAD in native artery 05/20/2021   Hypertension    PMB (postmenopausal bleeding)    Sickle cell trait (HCC)    Thickened endometrium    Type 2 diabetes mellitus ([place])    followed by pcp   Uterine fibroid    Wears glasses      Family History  Problem Relation Age of Onset   Hypertension Mother    Colon polyps Mother    Stroke Father    Colon cancer Neg Hx    Esophageal cancer Neg Hx    Rectal cancer Neg Hx    Stomach cancer Neg Hx    Liver disease Neg Hx    Pancreatic cancer Neg Hx    Prostate cancer Neg Hx      Current Outpatient Medications:    cholecalciferol (VITAMIN D) 1000 units tablet, Take 1,000 Units by mouth daily., Disp: , Rfl:    cyclobenzaprine (FLEXERIL) 10 MG tablet, Take 1 tablet (10 mg total) by mouth at bedtime as needed., Disp: 30 tablet, Rfl: 0   hydrochlorothiazide (HYDRODIURIL) 25 MG tablet, Take 1 tablet (25 mg total) by mouth daily., Disp: 90 tablet, Rfl: 3   ibuprofen (ADVIL,MOTRIN) 800 MG tablet, Take 800 mg by mouth every 8 (eight) hours as needed., Disp: , Rfl:    magnesium oxide (MAG-OX) 400 MG tablet, Take 400 mg by mouth as needed. , Disp: , Rfl:    mometasone (NASONEX) 50 MCG/ACT nasal spray, Place 2 sprays into the nose daily as needed. ,  Disp: , Rfl: 2   NON FORMULARY, Sea moss, Disp: , Rfl:    Omega-3 Fatty Acids (FISH OIL PO), Take 1,000 mg by mouth daily. , Disp: , Rfl:    OVER THE COUNTER MEDICATION, Apple cider vinegar 2 tsp daily, Disp: , Rfl:    rosuvastatin (CRESTOR) 20 MG tablet, Take 1 tablet (20 mg total) by mouth daily., Disp: 90 tablet, Rfl: 3   Semaglutide, 1 MG/DOSE, 4 MG/3ML SOPN, Inject 1 mg into the skin once a week., Disp: 3 mL, Rfl: 3   vitamin C (ASCORBIC ACID) 500 MG tablet, Take 500 mg by mouth as needed. , Disp: , Rfl:    No Known Allergies   Review of Systems  Constitutional: Negative.   HENT: Negative.    Eyes: Negative.  Negative for blurred vision.  Respiratory:  Negative.    Cardiovascular: Negative.   Gastrointestinal: Negative.   Musculoskeletal:  Positive for arthralgias.     Today's Vitals   03/29/22 0925  BP: 132/70  Pulse: 61  Temp: 98.5 F (36.9 C)  TempSrc: Oral  Weight: 186 lb (84.4 kg)  Height: _0  (1.626 m)  PainSc: 0-No pain   Body mass index is 31.93 kg/m.  Wt Readings from Last 3 Encounters:  03/29/22 186 lb (84.4 kg)  03/03/22 183 lb (83 kg)  10/20/21 183 lb (83 kg)    Objective:  Physical Exam Vitals and nursing note reviewed.  Constitutional:      Appearance: Normal appearance.  HENT:     Head: Normocephalic and atraumatic.     Nose:     Comments: Masked     Mouth/Throat:     Comments: Masked  Eyes:     Extraocular Movements: Extraocular movements intact.  Cardiovascular:     Rate and Rhythm: Normal rate and regular rhythm.     Heart sounds: Normal heart sounds.  Pulmonary:     Effort: Pulmonary effort is normal.     Breath sounds: Normal breath sounds.  Musculoskeletal:     Cervical back: Normal range of motion.  Skin:    General: Skin is warm.  Neurological:     General: No focal deficit present.     Mental Status: She is alert.  Psychiatric:        Mood and Affect: Mood normal.        Behavior: Behavior normal.      Assessment And Plan:     1. Uncontrolled type 2 diabetes mellitus with hyperglycemia (HCC) Comments: Chronic, I will increase her dose of Ozempic to 66m weekly. She will rto in 3 months for re-evaluation. - Hemoglobin A1c - CMP14+EGFR - TSH  2. Essential hypertension, benign Comments: Chronic, fair control. Goal BP<120/80 to decrease cardiac risk. Encouraged to limit her sodium intake.  3. Fall involving sidewalk curb, initial encounter Comments: Occurred on Mon 11/20. Advised to step onto the ground when getting out of her truck.  4. Acute pain of both knees Comments: Abrasions noted, advised to apply Voltaren gel to both knees two to three times daily prn.  5.  Vitamin D deficiency disease Comments: I will check vitamin D level and supplement as needed. - Vitamin D (25 hydroxy)   Patient was given opportunity to ask questions. Patient verbalized understanding of the plan and was able to repeat key elements of the plan. All questions were answered to their satisfaction.   I, RMaximino Greenland MD, have reviewedx all documentation for this visit. The documentation on 03/29/22 for the  exam, diagnosis, procedures, and orders are all accurate and complete.   IF YOU HAVE BEEN REFERRED TO A SPECIALIST, IT MAY TAKE 1-2 WEEKS TO SCHEDULE/PROCESS THE REFERRAL. IF YOU HAVE NOT HEARD FROM US/SPECIALIST IN TWO WEEKS, PLEASE GIVE [place] A CALL AT [phone] X [phone].   THE PATIENT IS ENCOURAGED TO PRACTICE SOCIAL DISTANCING DUE TO THE COVID-19 PANDEMIC.

## 2022-03-29 NOTE — Patient Instructions (Signed)

## 2022-03-30 LAB — CMP14+EGFR
ALT: 21 IU/L (ref 0–32)
AST: 18 IU/L (ref 0–40)
Albumin/Globulin Ratio: 1.7 (ref 1.2–2.2)
Albumin: 4.6 g/dL (ref 3.8–4.9)
Alkaline Phosphatase: 75 IU/L (ref 44–121)
BUN/Creatinine Ratio: 13 (ref 9–23)
BUN: 10 mg/dL (ref 6–24)
Bilirubin Total: 0.6 mg/dL (ref 0.0–1.2)
CO2: 26 mmol/L (ref 20–29)
Calcium: 9.8 mg/dL (ref 8.7–10.2)
Chloride: 95 mmol/L — ABNORMAL LOW (ref 96–106)
Creatinine, Ser: 0.77 mg/dL (ref 0.57–1.00)
Globulin, Total: 2.7 g/dL (ref 1.5–4.5)
Glucose: 99 mg/dL (ref 70–99)
Potassium: 4 mmol/L (ref 3.5–5.2)
Sodium: 140 mmol/L (ref 134–144)
Total Protein: 7.3 g/dL (ref 6.0–8.5)
eGFR: 91 mL/min/{1.73_m2} (ref >59)

## 2022-03-30 LAB — VITAMIN D 25 HYDROXY (VIT D DEFICIENCY, FRACTURES): Vit D, 25-Hydroxy: 79.3 ng/mL (ref 30.0–100.0)

## 2022-03-30 LAB — TSH: TSH: 1.26 u[IU]/mL (ref 0.450–4.500)

## 2022-05-02 ENCOUNTER — Other Ambulatory Visit (HOSPITAL_COMMUNITY): Payer: Self-pay

## 2022-06-07 ENCOUNTER — Ambulatory Visit (INDEPENDENT_AMBULATORY_CARE_PROVIDER_SITE_OTHER): Payer: 59 | Admitting: Internal Medicine

## 2022-06-07 ENCOUNTER — Encounter: Payer: Self-pay | Admitting: Internal Medicine

## 2022-06-07 VITALS — BP 132/90 | HR 81 | Temp 98.1°F | Ht 64.0 in

## 2022-06-07 DIAGNOSIS — S0511XA Contusion of eyeball and orbital tissues, right eye, initial encounter: Secondary | ICD-10-CM | POA: Diagnosis not present

## 2022-06-07 DIAGNOSIS — I10 Essential (primary) hypertension: Secondary | ICD-10-CM

## 2022-06-07 DIAGNOSIS — S0011XA Contusion of right eyelid and periocular area, initial encounter: Secondary | ICD-10-CM
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
## 2022-06-07 NOTE — Patient Instructions (Signed)
Eye Contusion An eye contusion is a deep bruise of the eye or the area around the eye. This injury is often called a "black eye." Contusions are the result of a blunt injury to tissues and muscle fibers under the skin. This causes bleeding under the skin. The skin over the contusion may turn blue, purple, green, or yellow. Minor injuries may be painless. More severe contusions may stay painful and swollen for a few weeks. Eye contusions can affect your eyeball and your eyesight. What are the causes? This condition may be caused by: A hard hit or direct force to your face, nose, or eye. A head injury that causes the blood under your skin to flow toward your eyelids. Facial surgery, such as a facelift or nose surgery. Dental work, including wisdom tooth extraction or dental implant surgery. What are the signs or symptoms? Symptoms of this condition include: Pain and swelling around your eye. Discoloration around your eye. The area may start out red and then turn blue, purple, green, or yellow. The involved area often spreads out over several days and changes color as it heals. Blurry vision. Tearing. Eyeball redness. How is this diagnosed? This condition is diagnosed based on a physical exam and your medical history. During the exam, your health care provider may: Test your vision and eye movements to help make sure that your eye is not injured. Shine a light into your eyes to make sure that your pupils react normally. Check the bones in your face and around your eye for injuries. You may also have: An eye exam including a vision test and checking your eye with a type of microscope (slit lamp) and measuring the pressure in your eye. Your eye may be dilated. An X-ray or a CT scan to check for other injuries, such as broken bones. How is this treated? An eye contusion usually heals on its own in a few days or weeks. If needed, this condition may be treated by: Icing your eye and taking pain  medicine. Surgery. This may be needed if you have broken bones or an injury to the eyeball. If the eyeball is injured, it may be treated with medicines or even surgery, depending on the type of injury. Follow these instructions at home: Managing pain and swelling  If directed, put ice on the injured area. To do this: Put ice in a plastic bag. Place a towel between your skin and the bag. Leave the ice on for 20 minutes, 2-3 times a day. Remove the ice if your skin turns bright red. This is very important. If you cannot feel pain, heat, or cold, you have a greater risk of damage to the area. General instructions Sleep with your head raised (elevated). You may do this by putting an extra pillow under your head. Return to your normal activities as told by your health care provider. Ask your health care provider what activities are safe for you. Take over-the-counter and prescription medicines only as told by your health care provider. Keep all follow-up visits. This is important. Contact a health care provider if: Your symptoms do not improve after several days. Your swelling or pain is not relieved with medicines. You feel nauseated or you vomit. Get help right away if: You have vision problems such as: Vision loss. Double vision. Seeing severe floaters, flashes of light, or a curtain blocking part of your vision. Your eye suddenly becomes red. Your pupil is an odd shape or size. You have severe pain or  a severe headache. You feel dizzy or sleepy, or you feel like you will faint. You faint. You have severe vomiting. You have a lot of clear fluid or blood coming from your eye or nose. These symptoms may represent a serious problem that is an emergency. Do not wait to see if the symptoms will go away. Get medical help right away. Call your local emergency services (911 in the [place] Do not drive yourself to the hospital. Summary An eye contusion is a deep bruise of the eye or the area  around the eye. Contusions are the result of a blunt injury to tissues and muscle fibers under the skin. An eye contusion usually heals on its own in a few days or weeks. If needed, it may be treated with ice and pain medicines. Surgery may be needed if you have broken bones or an injury to the eyeball. This information is not intended to replace advice given to you by your health care provider. Make sure you discuss any questions you have with your health care provider. Document Revised: 07/05/2020 Document Reviewed: 07/05/2020 Elsevier Patient Education  [place].

## 2022-06-11 ENCOUNTER — Encounter: Payer: Self-pay | Admitting: Internal Medicine

## 2022-06-28 ENCOUNTER — Other Ambulatory Visit (HOSPITAL_COMMUNITY): Payer: Self-pay

## 2022-07-03 ENCOUNTER — Other Ambulatory Visit (HOSPITAL_COMMUNITY): Payer: Self-pay

## 2022-07-04 ENCOUNTER — Encounter: Payer: Self-pay | Admitting: Pharmacist

## 2022-07-12 ENCOUNTER — Ambulatory Visit (INDEPENDENT_AMBULATORY_CARE_PROVIDER_SITE_OTHER): Payer: 59 | Admitting: Internal Medicine

## 2022-07-12 ENCOUNTER — Other Ambulatory Visit (HOSPITAL_COMMUNITY): Payer: Self-pay

## 2022-07-12 ENCOUNTER — Encounter: Payer: Self-pay | Admitting: Internal Medicine

## 2022-07-12 VITALS — BP 122/80 | HR 72 | Temp 98.1°F | Ht 64.0 in | Wt 185.2 lb

## 2022-07-12 DIAGNOSIS — I10 Essential (primary) hypertension: Secondary | ICD-10-CM | POA: Diagnosis not present

## 2022-07-12 DIAGNOSIS — E6609 Other obesity due to excess calories: Secondary | ICD-10-CM

## 2022-07-12 DIAGNOSIS — Z6831 Body mass index (BMI) 31.0-31.9, adult: Secondary | ICD-10-CM

## 2022-07-12 DIAGNOSIS — E785 Hyperlipidemia, unspecified: Secondary | ICD-10-CM | POA: Diagnosis not present

## 2022-07-12 DIAGNOSIS — E1169 Type 2 diabetes mellitus with other specified complication: Secondary | ICD-10-CM | POA: Diagnosis not present

## 2022-07-12 MED ORDER — VALSARTAN-HYDROCHLOROTHIAZIDE 80-12.5 MG PO TABS
1.0000 | ORAL_TABLET | Freq: Every day | ORAL | 11 refills | Status: DC
  Filled 2022-07-12: qty 30, 30d supply, fill #0
  Filled 2022-08-29: qty 30, 30d supply, fill #1
  Filled 2022-10-24: qty 30, 30d supply, fill #2
  Filled 2022-11-21: qty 30, 30d supply, fill #3
  Filled 2023-01-31: qty 30, 30d supply, fill #4
  Filled 2023-03-01: qty 30, 30d supply, fill #5
  Filled 2023-05-11: qty 30, 30d supply, fill #6
  Filled 2023-07-02: qty 30, 30d supply, fill #7

## 2022-07-12 NOTE — Progress Notes (Signed)
I,Sandra Sims,acting as a scribe for Sandra Greenland, MD.,have documented all relevant documentation on the behalf of Sandra Greenland, MD,as directed by  Sandra Greenland, MD while in the presence of Sandra Greenland, MD.    Subjective:     Patient ID: Sandra Sims , female    DOB: Sep 07, 1965 , 57 y.o.   MRN: [id number]   Chief Complaint  Patient presents with   Diabetes   Hypertension    HPI  Patient presents today for a DM/BP check.  She reports compliance with medications. She denies having any headaches, chest pain and shortness of breath.       Diabetes She presents for her follow-up diabetic visit. She has type 2 diabetes mellitus. Her disease course has been stable. Pertinent negatives for diabetes include no blurred vision. There are no hypoglycemic complications. Risk factors for coronary artery disease include diabetes mellitus, dyslipidemia, hypertension, obesity and post-menopausal. She is compliant with treatment most of the time. She is following a diabetic diet. She participates in exercise three times a week. An ACE inhibitor/angiotensin II receptor blocker is not being taken.  Hypertension This is a chronic problem. The current episode started more than 1 year ago. The problem has been gradually improving since onset. The problem is controlled. Pertinent negatives include no blurred vision. The current treatment provides moderate improvement. There are no compliance problems.      Past Medical History:  Diagnosis Date   Atherosclerosis of aorta ([place]) 05/20/2021   CAD in native artery 05/20/2021   Hypertension    PMB (postmenopausal bleeding)    Sickle cell trait ([facility])    Thickened endometrium    Type 2 diabetes mellitus ([place])    followed by pcp   Uterine fibroid    Wears glasses      Family History  Problem Relation Age of Onset   Hypertension Mother    Colon polyps Mother    Stroke Father    Colon cancer Neg Hx    Esophageal cancer Neg Hx     Rectal cancer Neg Hx    Stomach cancer Neg Hx    Liver disease Neg Hx    Pancreatic cancer Neg Hx    Prostate cancer Neg Hx      Current Outpatient Medications:    cholecalciferol (VITAMIN D) 1000 units tablet, Take 1,000 Units by mouth daily., Disp: , Rfl:    cyclobenzaprine (FLEXERIL) 10 MG tablet, Take 1 tablet (10 mg total) by mouth at bedtime as needed., Disp: 30 tablet, Rfl: 0   ibuprofen (ADVIL,MOTRIN) 800 MG tablet, Take 800 mg by mouth every 8 (eight) hours as needed., Disp: , Rfl:    magnesium oxide (MAG-OX) 400 MG tablet, Take 400 mg by mouth as needed. , Disp: , Rfl:    mometasone (NASONEX) 50 MCG/ACT nasal spray, Place 2 sprays into the nose daily as needed. , Disp: , Rfl: 2   NON FORMULARY, Sea moss, Disp: , Rfl:    Omega-3 Fatty Acids (FISH OIL PO), Take 1,000 mg by mouth daily. , Disp: , Rfl:    OVER THE COUNTER MEDICATION, Apple cider vinegar 2 tsp daily, Disp: , Rfl:    rosuvastatin (CRESTOR) 20 MG tablet, Take 1 tablet (20 mg total) by mouth daily., Disp: 90 tablet, Rfl: 3   Semaglutide, 1 MG/DOSE, 4 MG/3ML SOPN, Inject 1 mg into the skin once a week., Disp: 3 mL, Rfl: 3   valsartan-hydrochlorothiazide (DIOVAN HCT) 80-12.5 MG tablet, Take 1  tablet by mouth daily., Disp: 30 tablet, Rfl: 11   vitamin C (ASCORBIC ACID) 500 MG tablet, Take 500 mg by mouth as needed. , Disp: , Rfl:    No Known Allergies   Review of Systems  Constitutional: Negative.   Eyes:  Negative for blurred vision.  Respiratory: Negative.    Cardiovascular: Negative.   Gastrointestinal: Negative.   Musculoskeletal: Negative.   Neurological: Negative.   Psychiatric/Behavioral: Negative.       Today's Vitals   07/12/22 1018  BP: 122/80  Pulse: 72  Temp: 98.1 F (36.7 C)  SpO2: 98%  Weight: 185 lb 3.2 oz (84 kg)  Height: '5\' 4"'$  (1.626 m)   Body mass index is 31.79 kg/m.   Objective:  Physical Exam Vitals and nursing note reviewed.  Constitutional:      Appearance: Normal appearance.   HENT:     Head: Normocephalic and atraumatic.  Cardiovascular:     Rate and Rhythm: Normal rate and regular rhythm.     Heart sounds: Normal heart sounds.  Pulmonary:     Effort: Pulmonary effort is normal.     Breath sounds: Normal breath sounds.  Skin:    General: Skin is warm.  Neurological:     General: No focal deficit present.     Mental Status: She is alert.  Psychiatric:        Mood and Affect: Mood normal.        Behavior: Behavior normal.         Assessment And Plan:     1. Dyslipidemia associated with type 2 diabetes mellitus ([place]) Comments: Chronic, LDL goal < 70.  She has recently increased to '1mg'$  Ozempic, no issues thus far. I will check labs as below.  She was also given Contour meter. She will rto in June 2024 for her next CPE. - BMP8+eGFR; Future - Hemoglobin A1c; Future - Lipid panel; Future  2. Essential hypertension, benign Comments: Chronic, controlled. I will d/c HCTZ '25mg'$  and switch to valsartan/hct 80/12.'5mg'$  daily. She agrees to rto in 2 weeks for a nurse visit.  3. Class 1 obesity due to excess calories with serious comorbidity and body mass index (BMI) of 31.0 to 31.9 in adult Comments: She is encouraged to aim for at least 150 minutes of exercise/week.   Patient was given opportunity to ask questions. Patient verbalized understanding of the plan and was able to repeat key elements of the plan. All questions were answered to their satisfaction.   I, Sandra Greenland, MD, have reviewed all documentation for this visit. The documentation on 07/12/22 for the exam, diagnosis, procedures, and orders are all accurate and complete.   IF YOU HAVE BEEN REFERRED TO A SPECIALIST, IT MAY TAKE 1-2 WEEKS TO SCHEDULE/PROCESS THE REFERRAL. IF YOU HAVE NOT HEARD FROM US/SPECIALIST IN TWO WEEKS, PLEASE GIVE [place] A CALL AT [phone] X [phone].   THE PATIENT IS ENCOURAGED TO PRACTICE SOCIAL DISTANCING DUE TO THE COVID-19 PANDEMIC.

## 2022-07-12 NOTE — Patient Instructions (Signed)

## 2022-07-13 ENCOUNTER — Other Ambulatory Visit: Payer: 59

## 2022-07-13 DIAGNOSIS — E785 Hyperlipidemia, unspecified: Secondary | ICD-10-CM | POA: Diagnosis not present

## 2022-07-13 DIAGNOSIS — E1169 Type 2 diabetes mellitus with other specified complication: Secondary | ICD-10-CM

## 2022-07-14 LAB — BMP8+EGFR
BUN/Creatinine Ratio: 21 (ref 9–23)
BUN: 15 mg/dL (ref 6–24)
CO2: 26 mmol/L (ref 20–29)
Calcium: 10.3 mg/dL — ABNORMAL HIGH (ref 8.7–10.2)
Chloride: 99 mmol/L (ref 96–106)
Creatinine, Ser: 0.71 mg/dL (ref 0.57–1.00)
Glucose: 91 mg/dL (ref 70–99)
Potassium: 4.1 mmol/L (ref 3.5–5.2)
Sodium: 140 mmol/L (ref 134–144)
eGFR: 100 mL/min/{1.73_m2} (ref >59)

## 2022-07-14 LAB — LIPID PANEL
Chol/HDL Ratio: 2.4 ratio (ref 0.0–4.4)
Cholesterol, Total: 165 mg/dL (ref 100–199)
HDL: 69 mg/dL (ref >39)
LDL Chol Calc (NIH): 83 mg/dL (ref 0–99)
Triglycerides: 64 mg/dL (ref 0–149)
VLDL Cholesterol Cal: 13 mg/dL (ref 5–40)

## 2022-07-14 LAB — HEMOGLOBIN A1C
Est. average glucose Bld gHb Est-mCnc: 140 mg/dL
Hgb A1c MFr Bld: 6.5 % — ABNORMAL HIGH (ref 4.8–5.6)

## 2022-07-25 ENCOUNTER — Other Ambulatory Visit: Payer: Self-pay | Admitting: Internal Medicine

## 2022-07-25 ENCOUNTER — Ambulatory Visit: Payer: 59

## 2022-07-25 VITALS — BP 130/86 | HR 85 | Temp 98.5°F | Ht 64.0 in | Wt 185.0 lb

## 2022-07-25 DIAGNOSIS — Z0000 Encounter for general adult medical examination without abnormal findings: Secondary | ICD-10-CM

## 2022-07-25 DIAGNOSIS — I10 Essential (primary) hypertension: Secondary | ICD-10-CM

## 2022-07-25 NOTE — Progress Notes (Signed)
Patient presents today for BP check, patient currently taking Valsartan-hydrochlorothiazide 80-12.5mg .Patient has not yet started her Valsartan-hydrochlorothiazide . Patient is still finishing her hydrochlorothiazide . BP Readings from Last 3 Encounters:  07/25/22 130/80  07/12/22 122/80  06/07/22 (!) 132/90  Patient is to start new BP today, and NV in 2 weeks with lab work.

## 2022-07-28 ENCOUNTER — Other Ambulatory Visit: Payer: Self-pay

## 2022-08-01 ENCOUNTER — Other Ambulatory Visit (HOSPITAL_COMMUNITY): Payer: Self-pay

## 2022-08-10 ENCOUNTER — Other Ambulatory Visit (HOSPITAL_BASED_OUTPATIENT_CLINIC_OR_DEPARTMENT_OTHER): Payer: Self-pay | Admitting: Cardiovascular Disease

## 2022-08-10 ENCOUNTER — Other Ambulatory Visit (HOSPITAL_COMMUNITY): Payer: Self-pay

## 2022-08-10 MED ORDER — ROSUVASTATIN CALCIUM 20 MG PO TABS
20.0000 mg | ORAL_TABLET | Freq: Every day | ORAL | 0 refills | Status: DC
  Filled 2022-08-10: qty 90, 90d supply, fill #0

## 2022-08-14 NOTE — Progress Notes (Unsigned)
Patient presents today for a BPC and labs, patient currently taking Valsartan-HCTZ 80-12.5mg . BP Readings from Last 3 Encounters:  08/15/22 136/68  07/25/22 130/86  07/12/22 122/80  Per provider- Continue with current meds - goal is 120/60- exercise five days per week

## 2022-08-15 ENCOUNTER — Ambulatory Visit: Payer: 59

## 2022-08-15 VITALS — BP 128/68 | HR 84 | Temp 98.5°F | Ht 64.0 in | Wt 185.0 lb

## 2022-08-15 DIAGNOSIS — I10 Essential (primary) hypertension: Secondary | ICD-10-CM | POA: Diagnosis not present

## 2022-08-16 LAB — BMP8+EGFR
BUN/Creatinine Ratio: 10 (ref 9–23)
BUN: 8 mg/dL (ref 6–24)
CO2: 28 mmol/L (ref 20–29)
Calcium: 9.6 mg/dL (ref 8.7–10.2)
Chloride: 98 mmol/L (ref 96–106)
Creatinine, Ser: 0.81 mg/dL (ref 0.57–1.00)
Glucose: 123 mg/dL — ABNORMAL HIGH (ref 70–99)
Potassium: 3.6 mmol/L (ref 3.5–5.2)
Sodium: 141 mmol/L (ref 134–144)
eGFR: 85 mL/min/{1.73_m2} (ref >59)

## 2022-08-17 ENCOUNTER — Other Ambulatory Visit: Payer: 59

## 2022-08-18 ENCOUNTER — Other Ambulatory Visit: Payer: 59

## 2022-09-04 ENCOUNTER — Ambulatory Visit
Admission: RE | Admit: 2022-09-04 | Discharge: 2022-09-04 | Disposition: A | Discharge status: Home / Self Care | Payer: 59 | Source: Ambulatory Visit | Attending: Internal Medicine | Admitting: Internal Medicine

## 2022-09-04 DIAGNOSIS — Z1231 Encounter for screening mammogram for malignant neoplasm of breast: Secondary | ICD-10-CM | POA: Diagnosis not present

## 2022-09-04 DIAGNOSIS — Z0000 Encounter for general adult medical examination without abnormal findings: Secondary | ICD-10-CM

## 2022-09-25 ENCOUNTER — Other Ambulatory Visit: Payer: Self-pay | Admitting: Internal Medicine

## 2022-09-26 ENCOUNTER — Other Ambulatory Visit (HOSPITAL_COMMUNITY): Payer: Self-pay

## 2022-09-26 DIAGNOSIS — E119 Type 2 diabetes mellitus without complications: Secondary | ICD-10-CM | POA: Diagnosis not present

## 2022-09-26 DIAGNOSIS — Z780 Asymptomatic menopausal state: Secondary | ICD-10-CM | POA: Diagnosis not present

## 2022-09-26 DIAGNOSIS — I10 Essential (primary) hypertension: Secondary | ICD-10-CM | POA: Diagnosis not present

## 2022-09-26 DIAGNOSIS — Z01419 Encounter for gynecological examination (general) (routine) without abnormal findings: Secondary | ICD-10-CM | POA: Diagnosis not present

## 2022-09-26 MED ORDER — OZEMPIC (1 MG/DOSE) 4 MG/3ML ~~LOC~~ SOPN
1.0000 mg | PEN_INJECTOR | SUBCUTANEOUS | 3 refills | Status: DC
  Filled 2022-09-26: qty 3, 28d supply, fill #0
  Filled 2022-10-24: qty 3, 28d supply, fill #1
  Filled 2022-11-21: qty 3, 28d supply, fill #2
  Filled 2022-12-26: qty 3, 28d supply, fill #3

## 2022-10-30 ENCOUNTER — Other Ambulatory Visit: Payer: Self-pay | Admitting: Internal Medicine

## 2022-10-30 ENCOUNTER — Encounter: Payer: Self-pay | Admitting: Internal Medicine

## 2022-10-30 ENCOUNTER — Ambulatory Visit (INDEPENDENT_AMBULATORY_CARE_PROVIDER_SITE_OTHER): Payer: 59 | Admitting: Internal Medicine

## 2022-10-30 VITALS — BP 120/80 | HR 71 | Temp 98.5°F | Ht 64.0 in | Wt 180.6 lb

## 2022-10-30 DIAGNOSIS — E6609 Other obesity due to excess calories: Secondary | ICD-10-CM | POA: Diagnosis not present

## 2022-10-30 DIAGNOSIS — M79602 Pain in left arm: Secondary | ICD-10-CM

## 2022-10-30 DIAGNOSIS — L304 Erythema intertrigo: Secondary | ICD-10-CM | POA: Diagnosis not present

## 2022-10-30 DIAGNOSIS — E785 Hyperlipidemia, unspecified: Secondary | ICD-10-CM | POA: Diagnosis not present

## 2022-10-30 DIAGNOSIS — Z0000 Encounter for general adult medical examination without abnormal findings: Secondary | ICD-10-CM

## 2022-10-30 DIAGNOSIS — I10 Essential (primary) hypertension: Secondary | ICD-10-CM | POA: Diagnosis not present

## 2022-10-30 DIAGNOSIS — Z6831 Body mass index (BMI) 31.0-31.9, adult: Secondary | ICD-10-CM | POA: Diagnosis not present

## 2022-10-30 DIAGNOSIS — R3121 Asymptomatic microscopic hematuria: Secondary | ICD-10-CM | POA: Diagnosis not present

## 2022-10-30 DIAGNOSIS — E1169 Type 2 diabetes mellitus with other specified complication: Secondary | ICD-10-CM | POA: Diagnosis not present

## 2022-10-30 DIAGNOSIS — M25551 Pain in right hip: Secondary | ICD-10-CM

## 2022-10-30 LAB — POCT URINALYSIS DIP (CLINITEK)
Bilirubin, UA: NEGATIVE
Glucose, UA: NEGATIVE mg/dL
Ketones, POC UA: NEGATIVE mg/dL
Nitrite, UA: NEGATIVE
POC PROTEIN,UA: NEGATIVE
Spec Grav, UA: >=1.03 — AB (ref 1.010–1.025)
Urobilinogen, UA: 0.2 E.U./dL
pH, UA: 6 (ref 5.0–8.0)

## 2022-10-30 MED ORDER — NYSTATIN 100000 UNIT/GM EX POWD
1.0000 | Freq: Three times a day (TID) | CUTANEOUS | 0 refills | Status: AC
  Filled 2022-10-30: qty 45, 15d supply, fill #0

## 2022-10-30 NOTE — Progress Notes (Signed)
Subjective:    Patient ID: Sandra Sims , female    DOB: 04/20/1966 , 57 y.o.   MRN: [id number]  Chief Complaint  Patient presents with   Annual Exam   Diabetes   Hypertension    HPI  She is here today for a full physical examination.  She is followed by Dr. Cherly Hensen for her Gyn care.  She reports compliance with meds. She denies headaches, chest pain and shortness of breath.  She is happy to report she is exercising on a regular basis. She has no specific concerns at this time.   BP Readings from Last 3 Encounters: 10/30/22 : 126/88 08/15/22 : 128/68 07/25/22 : 130/86    Diabetes She presents for her follow-up diabetic visit. She has type 2 diabetes mellitus. There are no hypoglycemic associated symptoms. Pertinent negatives for diabetes include no blurred vision and no chest pain. There are no hypoglycemic complications. Symptoms are stable. There are no diabetic complications. Risk factors for coronary artery disease include diabetes mellitus, dyslipidemia, hypertension, obesity and post-menopausal. She is compliant with treatment most of the time. She is following a diabetic diet. She participates in exercise three times a week. An ACE inhibitor/angiotensin II receptor blocker is not being taken.  Hypertension This is a chronic problem. The current episode started more than 1 year ago. The problem has been gradually improving since onset. The problem is controlled. Pertinent negatives include no blurred vision or chest pain. The current treatment provides moderate improvement. There are no compliance problems.      Past Medical History:  Diagnosis Date   Atherosclerosis of aorta (HCC) 05/20/2021   CAD in native artery 05/20/2021   Hypertension    PMB (postmenopausal bleeding)    Sickle cell trait (HCC)    Thickened endometrium    Type 2 diabetes mellitus (HCC)    followed by pcp   Uterine fibroid    Wears glasses      Family History  Problem Relation Age of Onset    Hypertension Mother    Colon polyps Mother    Stroke Father    Colon cancer Neg Hx    Esophageal cancer Neg Hx    Rectal cancer Neg Hx    Stomach cancer Neg Hx    Liver disease Neg Hx    Pancreatic cancer Neg Hx    Prostate cancer Neg Hx      Current Outpatient Medications:    cholecalciferol (VITAMIN D) 1000 units tablet, Take 1,000 Units by mouth daily., Disp: , Rfl:    cyclobenzaprine (FLEXERIL) 10 MG tablet, Take 1 tablet (10 mg total) by mouth at bedtime as needed., Disp: 30 tablet, Rfl: 0   ibuprofen (ADVIL,MOTRIN) 800 MG tablet, Take 800 mg by mouth every 8 (eight) hours as needed., Disp: , Rfl:    magnesium oxide (MAG-OX) 400 MG tablet, Take 400 mg by mouth as needed. , Disp: , Rfl:    mometasone (NASONEX) 50 MCG/ACT nasal spray, Place 2 sprays into the nose daily as needed. , Disp: , Rfl: 2   NON FORMULARY, Sea moss, Disp: , Rfl:    nystatin powder, Apply 1 Application topically 3 (three) times daily. prn, Disp: 45 g, Rfl: 0   Omega-3 Fatty Acids (FISH OIL PO), Take 1,000 mg by mouth daily. , Disp: , Rfl:    OVER THE COUNTER MEDICATION, Apple cider vinegar 2 tsp daily, Disp: , Rfl:    rosuvastatin (CRESTOR) 20 MG tablet, Take 1 tablet (20 mg total)  by mouth daily. Need appointment, Disp: 90 tablet, Rfl: 0   Semaglutide, 1 MG/DOSE, (OZEMPIC, 1 MG/DOSE,) 4 MG/3ML SOPN, Inject 1 mg into the skin once a week., Disp: 3 mL, Rfl: 3   valsartan-hydrochlorothiazide (DIOVAN HCT) 80-12.5 MG tablet, Take 1 tablet by mouth daily., Disp: 30 tablet, Rfl: 11   vitamin C (ASCORBIC ACID) 500 MG tablet, Take 500 mg by mouth as needed. , Disp: , Rfl:    No Known Allergies    The patient states she uses post menopausal status for birth control. No LMP recorded (lmp unknown). Patient is postmenopausal.. Negative for Dysmenorrhea. Negative for: breast discharge, breast lump(s), breast pain and breast self exam. Associated symptoms include abnormal vaginal bleeding. Pertinent negatives include  abnormal bleeding (hematology), anxiety, decreased libido, depression, difficulty falling sleep, dyspareunia, history of infertility, nocturia, sexual dysfunction, sleep disturbances, urinary incontinence, urinary urgency, vaginal discharge and vaginal itching. Diet regular.The patient states her exercise level is  2-3x/week.  . The patient's tobacco use is:  Social History   Tobacco Use  Smoking Status Former   Packs/day: 0.50   Years: 11.00   Additional pack years: 0.00   Total pack years: 5.50   Types: Cigarettes   Quit date: 02/06/1995   Years since quitting: 27.7  Smokeless Tobacco Never  . She has been exposed to passive smoke. The patient's alcohol use is:  Social History   Substance and Sexual Activity  Alcohol Use No   Review of Systems  Constitutional: Negative.   HENT: Negative.    Eyes: Negative.  Negative for blurred vision.  Respiratory: Negative.    Cardiovascular: Negative.  Negative for chest pain.  Gastrointestinal: Negative.   Endocrine: Negative.   Genitourinary: Negative.   Musculoskeletal:  Positive for arthralgias.       She c/o right hip pain. Denies fall/trauma.  She reports sx started about 3 months ago. Described as a burning sensation. She is unable to lay on her right side for long periods of time due to the pain. It sometimes awakens her at night which causes her to roll over onto the other side. Denies RLE weakness.   She also has left arm pain. Denies fall. Doesn't remember hitting it. She works with [employer] on the weekends, she admits she lifts a lot of sterno repeatedly. She thinks she may have strained a muscle.   Skin: Negative.   Allergic/Immunologic: Negative.   Neurological: Negative.   Hematological: Negative.   Psychiatric/Behavioral: Negative.       Today's Vitals   10/30/22 0905 10/30/22 0934  BP: 126/88 120/80  Pulse: 71   Temp: 98.5 F (36.9 C)   TempSrc: Oral   Weight: 180 lb 9.6 oz (81.9 kg)   Height: 5\' 4"  (1.626 m)    PainSc: 0-No pain    Body mass index is 31 kg/m.  Wt Readings from Last 3 Encounters:  10/30/22 180 lb 9.6 oz (81.9 kg)  08/15/22 185 lb (83.9 kg)  07/25/22 185 lb (83.9 kg)     Objective:  Physical Exam Vitals and nursing note reviewed.  Constitutional:      Appearance: Normal appearance.  HENT:     Head: Normocephalic and atraumatic.     Right Ear: Tympanic membrane, ear canal and external ear normal.     Left Ear: Tympanic membrane, ear canal and external ear normal.     Nose: Nose normal.     Mouth/Throat:     Mouth: Mucous membranes are moist.  Pharynx: Oropharynx is clear.  Eyes:     Extraocular Movements: Extraocular movements intact.     Conjunctiva/sclera: Conjunctivae normal.     Pupils: Pupils are equal, round, and reactive to light.  Cardiovascular:     Rate and Rhythm: Normal rate and regular rhythm.     Pulses: Normal pulses.          Dorsalis pedis pulses are 2+ on the right side and 2+ on the left side.     Heart sounds: Normal heart sounds.  Pulmonary:     Effort: Pulmonary effort is normal.     Breath sounds: Normal breath sounds.  Chest:  Breasts:    Tanner Score is 5.     Right: Normal.     Left: Normal.  Abdominal:     General: Abdomen is flat. Bowel sounds are normal.     Palpations: Abdomen is soft.  Genitourinary:    Comments: deferred Musculoskeletal:        General: Tenderness present. Normal range of motion.     Cervical back: Normal range of motion and neck supple.     Comments: Right lateral hip tender to deep palpation  Feet:     Right foot:     Protective Sensation: 5 sites tested.  5 sites sensed.     Skin integrity: Skin integrity normal.     Toenail Condition: Right toenails are normal.     Left foot:     Protective Sensation: 5 sites tested.  5 sites sensed.     Skin integrity: Skin integrity normal.     Toenail Condition: Left toenails are normal.  Skin:    General: Skin is warm and dry.     Findings: Erythema and  rash present.     Comments: Hyperpigmented, sl erythematous rash beneath both breasts  Neurological:     General: No focal deficit present.     Mental Status: She is alert and oriented to person, place, and time.  Psychiatric:        Mood and Affect: Mood normal.        Behavior: Behavior normal.         Assessment And Plan:     1. Routine general medical examination at health care facility Comments: A full exam was performed. Importance of monthly self breast exams was discussed with the patient.  PATIENT IS ADVISED TO GET 30-45 MINUTES REGULAR EXERCISE NO LESS THAN FOUR TO FIVE DAYS PER WEEK - BOTH WEIGHTBEARING EXERCISES AND AEROBIC ARE RECOMMENDED.  PATIENT IS ADVISED TO FOLLOW A HEALTHY DIET WITH AT LEAST SIX FRUITS/VEGGIES PER DAY, DECREASE INTAKE OF RED MEAT, AND TO INCREASE FISH INTAKE TO TWO DAYS PER WEEK.  MEATS/FISH SHOULD NOT BE FRIED, BAKED OR BROILED IS PREFERABLE.  IT IS ALSO IMPORTANT TO CUT BACK ON YOUR SUGAR INTAKE. PLEASE AVOID ANYTHING WITH ADDED SUGAR, CORN SYRUP OR OTHER SWEETENERS. IF YOU MUST USE A SWEETENER, YOU CAN TRY STEVIA. IT IS ALSO IMPORTANT TO AVOID ARTIFICIALLY SWEETENERS AND DIET BEVERAGES. LASTLY, I SUGGEST WEARING SPF 50 SUNSCREEN ON EXPOSED PARTS AND ESPECIALLY WHEN IN THE DIRECT SUNLIGHT FOR AN EXTENDED PERIOD OF TIME.  PLEASE AVOID FAST FOOD RESTAURANTS AND INCREASE YOUR WATER INTAKE. - CBC with Differential/Platelet - CMP14+EGFR - Hemoglobin A1c  2. Dyslipidemia associated with type 2 diabetes mellitus (HCC) Comments: Chronic, LDL goal < 70.  She is scheduled for  eye exam 11/29/22.  Diabetic foot exam was performed.  She will f/u in 4 months. I DISCUSSED  WITH THE PATIENT AT LENGTH REGARDING THE GOALS OF GLYCEMIC CONTROL AND POSSIBLE LONG-TERM COMPLICATIONS.  I  ALSO STRESSED THE IMPORTANCE OF COMPLIANCE WITH HOME GLUCOSE MONITORING, DIETARY RESTRICTIONS INCLUDING AVOIDANCE OF SUGARY DRINKS/PROCESSED FOODS,  ALONG WITH REGULAR EXERCISE.  I  ALSO STRESSED  THE IMPORTANCE OF ANNUAL EYE EXAMS, SELF FOOT CARE AND COMPLIANCE WITH OFFICE VISITS.   3. Essential hypertension, benign Comments: Chronic, controlled. EKG performed, NSr w/o acute changes. She will c/w valsartan/hct 80/12.5mg  daily. Advised to follow low sodium diet. F/u 4 months. - POCT URINALYSIS DIP (CLINITEK) - Microalbumin / creatinine urine ratio - EKG 12-Lead  4. Intertrigo Comments: She is advised to resume nystatin powder to affected area twice daily as needed.  5. Right hip pain Comments: Sx are suggestive of trochanteric bursitis. NSAIds aren't ideal. She will try lidocaine patch prn for now.  6. Left arm pain Comments: Appears to be muscular in nature. Exam is benign, sx should resolve with time. May benefit from Graston therapy if sx persist.  7. Asymptomatic microscopic hematuria Comments: U/a results discussed with the patient. Will repeat at next visit.  8. Class 1 obesity due to excess calories with serious comorbidity and body mass index (BMI) of 31.0 to 31.9 in adult Comments: She is encouraged to aim for at least 150 minutes of exercise/week.    Return for 1 year physical, 4 month DM. Patient was given opportunity to ask questions. Patient verbalized understanding of the plan and was able to repeat key elements of the plan. All questions were answered to their satisfaction.    I, [name], MD, have reviewed all documentation for this visit. The documentation on 10/30/22 for the exam, diagnosis, procedures, and orders are all accurate and complete.

## 2022-10-30 NOTE — Patient Instructions (Signed)

## 2022-10-31 ENCOUNTER — Other Ambulatory Visit: Payer: Self-pay

## 2022-10-31 ENCOUNTER — Other Ambulatory Visit (HOSPITAL_COMMUNITY): Payer: Self-pay

## 2022-10-31 LAB — CMP14+EGFR
ALT: 14 IU/L (ref 0–32)
AST: 18 IU/L (ref 0–40)
Albumin: 4.3 g/dL (ref 3.8–4.9)
Alkaline Phosphatase: 75 IU/L (ref 44–121)
BUN/Creatinine Ratio: 21 (ref 9–23)
BUN: 14 mg/dL (ref 6–24)
Bilirubin Total: 0.7 mg/dL (ref 0.0–1.2)
CO2: 24 mmol/L (ref 20–29)
Calcium: 9.2 mg/dL (ref 8.7–10.2)
Chloride: 105 mmol/L (ref 96–106)
Creatinine, Ser: 0.67 mg/dL (ref 0.57–1.00)
Globulin, Total: 2.7 g/dL (ref 1.5–4.5)
Glucose: 85 mg/dL (ref 70–99)
Potassium: 4.2 mmol/L (ref 3.5–5.2)
Sodium: 143 mmol/L (ref 134–144)
Total Protein: 7 g/dL (ref 6.0–8.5)
eGFR: 103 mL/min/{1.73_m2} (ref >59)

## 2022-10-31 LAB — MICROALBUMIN / CREATININE URINE RATIO
Creatinine, Urine: 73 mg/dL
Microalb/Creat Ratio: 5 mg/g creat (ref 0–29)
Microalbumin, Urine: 3.7 ug/mL

## 2022-10-31 LAB — CBC WITH DIFFERENTIAL/PLATELET
Basophils Absolute: 0 10*3/uL (ref 0.0–0.2)
Basos: 1 %
EOS (ABSOLUTE): 0.2 10*3/uL (ref 0.0–0.4)
Eos: 5 %
Hematocrit: 39.3 % (ref 34.0–46.6)
Hemoglobin: 12.2 g/dL (ref 11.1–15.9)
Immature Grans (Abs): 0 10*3/uL (ref 0.0–0.1)
Immature Granulocytes: 0 %
Lymphocytes Absolute: 1.7 10*3/uL (ref 0.7–3.1)
Lymphs: 37 %
MCH: 25.2 pg — ABNORMAL LOW (ref 26.6–33.0)
MCHC: 31 g/dL — ABNORMAL LOW (ref 31.5–35.7)
MCV: 81 fL (ref 79–97)
Monocytes Absolute: 0.3 10*3/uL (ref 0.1–0.9)
Monocytes: 7 %
Neutrophils Absolute: 2.3 10*3/uL (ref 1.4–7.0)
Neutrophils: 50 %
Platelets: 283 10*3/uL (ref 150–450)
RBC: 4.84 x10E6/uL (ref 3.77–5.28)
RDW: 13.9 % (ref 11.7–15.4)
WBC: 4.5 10*3/uL (ref 3.4–10.8)

## 2022-10-31 LAB — HEMOGLOBIN A1C
Est. average glucose Bld gHb Est-mCnc: 140 mg/dL
Hgb A1c MFr Bld: 6.5 % — ABNORMAL HIGH (ref 4.8–5.6)

## 2022-11-17 LAB — HM DIABETES EYE EXAM

## 2022-11-21 ENCOUNTER — Other Ambulatory Visit (HOSPITAL_COMMUNITY): Payer: Self-pay

## 2022-11-21 ENCOUNTER — Other Ambulatory Visit (HOSPITAL_BASED_OUTPATIENT_CLINIC_OR_DEPARTMENT_OTHER): Payer: Self-pay | Admitting: Cardiovascular Disease

## 2022-11-21 MED ORDER — ROSUVASTATIN CALCIUM 20 MG PO TABS
20.0000 mg | ORAL_TABLET | Freq: Every day | ORAL | 0 refills | Status: DC
  Filled 2022-11-21: qty 30, 30d supply, fill #0

## 2022-11-22 ENCOUNTER — Other Ambulatory Visit: Payer: Self-pay

## 2022-11-22 ENCOUNTER — Other Ambulatory Visit (HOSPITAL_COMMUNITY): Payer: Self-pay

## 2023-01-04 ENCOUNTER — Encounter: Payer: Self-pay | Admitting: Nurse Practitioner

## 2023-01-04 ENCOUNTER — Other Ambulatory Visit (HOSPITAL_COMMUNITY): Payer: Self-pay

## 2023-01-04 ENCOUNTER — Ambulatory Visit (INDEPENDENT_AMBULATORY_CARE_PROVIDER_SITE_OTHER): Payer: 59 | Admitting: Nurse Practitioner

## 2023-01-04 VITALS — BP 116/80 | HR 71 | Temp 98.5°F | Ht 64.0 in | Wt 178.8 lb

## 2023-01-04 DIAGNOSIS — M62838 Other muscle spasm: Secondary | ICD-10-CM

## 2023-01-04 DIAGNOSIS — M542 Cervicalgia: Secondary | ICD-10-CM

## 2023-01-04 MED ORDER — KETOROLAC TROMETHAMINE 60 MG/2ML IM SOLN
60.0000 mg | Freq: Once | INTRAMUSCULAR | Status: AC
Start: 2023-01-04 — End: 2023-01-04
  Administered 2023-01-04: 60 mg via INTRAMUSCULAR

## 2023-01-04 MED ORDER — CYCLOBENZAPRINE HCL 10 MG PO TABS
10.0000 mg | ORAL_TABLET | Freq: Every evening | ORAL | 0 refills | Status: DC | PRN
  Filled 2023-01-04: qty 30, 30d supply, fill #0

## 2023-01-04 NOTE — Progress Notes (Signed)
Madelaine Bhat, CMA,acting as a Neurosurgeon for Arnette Felts, FNP.,have documented all relevant documentation on the behalf of Arnette Felts, FNP,as directed by  Arnette Felts, FNP while in the presence of Arnette Felts, FNP.  Subjective:  Patient ID: Sandra Sims , female    DOB: 1965-09-30 , 57 y.o.   MRN: [id number]  Chief Complaint  Patient presents with   Neck Pain    HPI  Patient presents today for neck pain, patient reports compliance with medications. Patient denies any SOB, Chest pains, or headaches. Patient reports sleeping on neck wrong, she reports the neck pain started last Friday. She has flat pillows that she rolls up and lies on her side. She has taken voltaren gel, vicks, aleve. Aching pain.   BP Readings from Last 3 Encounters: 01/04/23 : 116/80 10/30/22 : 120/80 08/15/22 : 128/68       Past Medical History:  Diagnosis Date   Atherosclerosis of aorta (HCC) 05/20/2021   CAD in native artery 05/20/2021   Hypertension    PMB (postmenopausal bleeding)    Sickle cell trait (HCC)    Thickened endometrium    Type 2 diabetes mellitus (HCC)    followed by pcp   Uterine fibroid    Wears glasses      Family History  Problem Relation Age of Onset   Hypertension Mother    Colon polyps Mother    Stroke Father    Colon cancer Neg Hx    Esophageal cancer Neg Hx    Rectal cancer Neg Hx    Stomach cancer Neg Hx    Liver disease Neg Hx    Pancreatic cancer Neg Hx    Prostate cancer Neg Hx      Current Outpatient Medications:    cholecalciferol (VITAMIN D) 1000 units tablet, Take 1,000 Units by mouth daily., Disp: , Rfl:    ibuprofen (ADVIL,MOTRIN) 800 MG tablet, Take 800 mg by mouth every 8 (eight) hours as needed., Disp: , Rfl:    magnesium oxide (MAG-OX) 400 MG tablet, Take 400 mg by mouth as needed. , Disp: , Rfl:    mometasone (NASONEX) 50 MCG/ACT nasal spray, Place 2 sprays into the nose daily as needed. , Disp: , Rfl: 2   NON FORMULARY, Sea moss, Disp: , Rfl:     nystatin powder, Apply 1 Application topically 3 (three) times daily. as needed, Disp: 45 g, Rfl: 0   Omega-3 Fatty Acids (FISH OIL PO), Take 1,000 mg by mouth daily. , Disp: , Rfl:    OVER THE COUNTER MEDICATION, Apple cider vinegar 2 tsp daily, Disp: , Rfl:    rosuvastatin (CRESTOR) 20 MG tablet, Take 1 tablet (20 mg total) by mouth daily. Need appointment, Disp: 30 tablet, Rfl: 0   Semaglutide, 1 MG/DOSE, (OZEMPIC, 1 MG/DOSE,) 4 MG/3ML SOPN, Inject 1 mg into the skin once a week., Disp: 3 mL, Rfl: 3   valsartan-hydrochlorothiazide (DIOVAN HCT) 80-12.5 MG tablet, Take 1 tablet by mouth daily., Disp: 30 tablet, Rfl: 11   vitamin C (ASCORBIC ACID) 500 MG tablet, Take 500 mg by mouth as needed. , Disp: , Rfl:    cyclobenzaprine (FLEXERIL) 10 MG tablet, Take 1 tablet (10 mg total) by mouth at bedtime as needed., Disp: 30 tablet, Rfl: 0   No Known Allergies   Review of Systems  Constitutional: Negative.  Negative for fever.  Respiratory: Negative.    Cardiovascular: Negative.   Neurological: Negative.   Psychiatric/Behavioral: Negative.  Today's Vitals   01/04/23 0831  BP: 116/80  Pulse: 71  Temp: 98.5 F (36.9 C)  TempSrc: Oral  SpO2: 98%  Weight: 178 lb 12.8 oz (81.1 kg)  Height: 5\' 4"  (1.626 m)  PainSc: 6   PainLoc: Neck   Body mass index is 30.69 kg/m.  Wt Readings from Last 3 Encounters:  01/04/23 178 lb 12.8 oz (81.1 kg)  10/30/22 180 lb 9.6 oz (81.9 kg)  08/15/22 185 lb (83.9 kg)     Objective:  Physical Exam Vitals reviewed.  Constitutional:      General: She is not in acute distress.    Appearance: Normal appearance. She is obese.  Cardiovascular:     Pulses: Normal pulses.     Heart sounds: Normal heart sounds. No murmur heard. Pulmonary:     Effort: Pulmonary effort is normal. No respiratory distress.     Breath sounds: Normal breath sounds. No wheezing.  Musculoskeletal:     Cervical back: Tenderness (left lateral neck with muscle tension)  present.  Lymphadenopathy:     Cervical: No cervical adenopathy.  Neurological:     Mental Status: She is alert.         Assessment And Plan:  Neck pain Assessment & Plan: Tenderness to posterior neck and with right rotation on the left lateral neck. Will treat with toradol injection and muscle relaxer to avoid steroids due to history of  diabetes  Orders: -     Cyclobenzaprine HCl; Take 1 tablet (10 mg total) by mouth at bedtime as needed.  Dispense: 30 tablet; Refill: 0 -     Ketorolac Tromethamine  Neck muscle spasm Assessment & Plan: Encouraged to use heating pad and neck exercises     Return if symptoms worsen or fail to improve.  Patient was given opportunity to ask questions. Patient verbalized understanding of the plan and was able to repeat key elements of the plan. All questions were answered to their satisfaction.    Jeanell Sparrow, FNP, have reviewed all documentation for this visit. The documentation on 01/04/23 for the exam, diagnosis, procedures, and orders are all accurate and complete.   IF YOU HAVE BEEN REFERRED TO A SPECIALIST, IT MAY TAKE 1-2 WEEKS TO SCHEDULE/PROCESS THE REFERRAL. IF YOU HAVE NOT HEARD FROM US/SPECIALIST IN TWO WEEKS, PLEASE GIVE [place] A CALL AT [phone].

## 2023-01-21 DIAGNOSIS — M542 Cervicalgia: Secondary | ICD-10-CM | POA: Insufficient documentation

## 2023-01-21 DIAGNOSIS — M62838 Other muscle spasm: Secondary | ICD-10-CM | POA: Insufficient documentation

## 2023-01-21 NOTE — Assessment & Plan Note (Signed)
Encouraged to use heating pad and neck exercises

## 2023-01-21 NOTE — Assessment & Plan Note (Addendum)
Tenderness to posterior neck and with right rotation on the left lateral neck. Will treat with toradol injection and muscle relaxer to avoid steroids due to history of  diabetes

## 2023-01-31 ENCOUNTER — Other Ambulatory Visit: Payer: Self-pay

## 2023-01-31 ENCOUNTER — Other Ambulatory Visit: Payer: Self-pay | Admitting: Internal Medicine

## 2023-02-01 ENCOUNTER — Other Ambulatory Visit (HOSPITAL_COMMUNITY): Payer: Self-pay

## 2023-02-01 MED ORDER — OZEMPIC (1 MG/DOSE) 4 MG/3ML ~~LOC~~ SOPN
1.0000 mg | PEN_INJECTOR | SUBCUTANEOUS | 3 refills | Status: DC
  Filled 2023-02-01: qty 3, 28d supply, fill #0
  Filled 2023-03-01: qty 3, 28d supply, fill #1
  Filled 2023-04-11: qty 3, 28d supply, fill #2
  Filled 2023-05-11: qty 3, 28d supply, fill #3

## 2023-02-20 ENCOUNTER — Ambulatory Visit: Payer: 59

## 2023-03-01 ENCOUNTER — Other Ambulatory Visit (HOSPITAL_COMMUNITY): Payer: Self-pay

## 2023-03-01 ENCOUNTER — Ambulatory Visit (INDEPENDENT_AMBULATORY_CARE_PROVIDER_SITE_OTHER): Payer: 59 | Admitting: Internal Medicine

## 2023-03-01 ENCOUNTER — Encounter: Payer: Self-pay | Admitting: Internal Medicine

## 2023-03-01 VITALS — BP 124/82 | HR 70 | Temp 97.8°F | Ht 64.0 in | Wt 176.4 lb

## 2023-03-01 DIAGNOSIS — I119 Hypertensive heart disease without heart failure: Secondary | ICD-10-CM | POA: Diagnosis not present

## 2023-03-01 DIAGNOSIS — E785 Hyperlipidemia, unspecified: Secondary | ICD-10-CM | POA: Diagnosis not present

## 2023-03-01 DIAGNOSIS — Z6830 Body mass index (BMI) 30.0-30.9, adult: Secondary | ICD-10-CM | POA: Diagnosis not present

## 2023-03-01 DIAGNOSIS — I700 Atherosclerosis of aorta: Secondary | ICD-10-CM | POA: Diagnosis not present

## 2023-03-01 DIAGNOSIS — E66811 Obesity, class 1: Secondary | ICD-10-CM | POA: Diagnosis not present

## 2023-03-01 DIAGNOSIS — E6609 Other obesity due to excess calories: Secondary | ICD-10-CM | POA: Diagnosis not present

## 2023-03-01 DIAGNOSIS — D573 Sickle-cell trait: Secondary | ICD-10-CM

## 2023-03-01 DIAGNOSIS — E1169 Type 2 diabetes mellitus with other specified complication: Secondary | ICD-10-CM | POA: Diagnosis not present

## 2023-03-01 MED ORDER — ROSUVASTATIN CALCIUM 20 MG PO TABS
20.0000 mg | ORAL_TABLET | Freq: Every day | ORAL | 2 refills | Status: DC
  Filled 2023-03-01: qty 90, 90d supply, fill #0
  Filled 2024-01-08: qty 90, 90d supply, fill #1

## 2023-03-01 NOTE — Patient Instructions (Signed)

## 2023-03-01 NOTE — Progress Notes (Signed)
I,Victoria T Deloria Lair, CMA,acting as a Neurosurgeon for [name], MD.,have documented all relevant documentation on the behalf of [name], MD,as directed by  [name], MD while in the presence of [name], MD.  Subjective:  Patient ID: Sandra Sims , female    DOB: Mar 15, 1966 , 57 y.o.   MRN: [id number]  Chief Complaint  Patient presents with   Diabetes   Hypertension    HPI  Patient presents today for a DM/BP check.  She reports compliance with medications. She denies having any headaches, chest pain and shortness of breath.    She has no specific concerns or complaints at this time.     Diabetes She presents for her follow-up diabetic visit. She has type 2 diabetes mellitus. Her disease course has been stable. Pertinent negatives for diabetes include no blurred vision. There are no hypoglycemic complications. Risk factors for coronary artery disease include diabetes mellitus, dyslipidemia, hypertension, obesity and post-menopausal. She is compliant with treatment most of the time. She is following a diabetic diet. She participates in exercise three times a week. An ACE inhibitor/angiotensin II receptor blocker is not being taken.  Hypertension This is a chronic problem. The current episode started more than 1 year ago. The problem has been gradually improving since onset. The problem is controlled. Pertinent negatives include no blurred vision. The current treatment provides moderate improvement. There are no compliance problems.      Past Medical History:  Diagnosis Date   Atherosclerosis of aorta (HCC) 05/20/2021   CAD in native artery 05/20/2021   Hypertension    PMB (postmenopausal bleeding)    Sickle cell trait (HCC)    Thickened endometrium    Type 2 diabetes mellitus (HCC)    followed by pcp   Uterine fibroid    Wears glasses      Family History  Problem Relation Age of Onset   Hypertension Mother    Colon polyps Mother    Stroke Father     Colon cancer Neg Hx    Esophageal cancer Neg Hx    Rectal cancer Neg Hx    Stomach cancer Neg Hx    Liver disease Neg Hx    Pancreatic cancer Neg Hx    Prostate cancer Neg Hx      Current Outpatient Medications:    cholecalciferol (VITAMIN D) 1000 units tablet, Take 1,000 Units by mouth daily., Disp: , Rfl:    cyclobenzaprine (FLEXERIL) 10 MG tablet, Take 1 tablet (10 mg total) by mouth at bedtime as needed., Disp: 30 tablet, Rfl: 0   ibuprofen (ADVIL,MOTRIN) 800 MG tablet, Take 800 mg by mouth every 8 (eight) hours as needed., Disp: , Rfl:    mometasone (NASONEX) 50 MCG/ACT nasal spray, Place 2 sprays into the nose daily as needed. , Disp: , Rfl: 2   NON FORMULARY, Sea moss, Disp: , Rfl:    nystatin powder, Apply 1 Application topically 3 (three) times daily. as needed, Disp: 45 g, Rfl: 0   Omega-3 Fatty Acids (FISH OIL PO), Take 1,000 mg by mouth daily. , Disp: , Rfl:    OVER THE COUNTER MEDICATION, Apple cider vinegar 2 tsp daily, Disp: , Rfl:    Semaglutide, 1 MG/DOSE, (OZEMPIC, 1 MG/DOSE,) 4 MG/3ML SOPN, Inject 1 mg into the skin once a week., Disp: 3 mL, Rfl: 3   valsartan-hydrochlorothiazide (DIOVAN HCT) 80-12.5 MG tablet, Take 1 tablet by mouth daily., Disp: 30 tablet, Rfl: 11   vitamin C (  ASCORBIC ACID) 500 MG tablet, Take 500 mg by mouth as needed. , Disp: , Rfl:    magnesium oxide (MAG-OX) 400 MG tablet, Take 400 mg by mouth as needed.  (Patient not taking: Reported on 03/01/2023), Disp: , Rfl:    rosuvastatin (CRESTOR) 20 MG tablet, Take 1 tablet (20 mg total) by mouth daily., Disp: 90 tablet, Rfl: 2   No Known Allergies   Review of Systems  Constitutional: Negative.   Eyes:  Negative for blurred vision.  Respiratory: Negative.    Cardiovascular: Negative.   Gastrointestinal: Negative.   Neurological: Negative.   Psychiatric/Behavioral: Negative.       Today's Vitals   03/01/23 0903  BP: 124/82  Pulse: 70  Temp: 97.8 F (36.6 C)  SpO2: 98%  Weight: 176 lb 6.4  oz (80 kg)  Height: 5\' 4"  (1.626 m)   Body mass index is 30.28 kg/m.  Wt Readings from Last 3 Encounters:  03/01/23 176 lb 6.4 oz (80 kg)  01/04/23 178 lb 12.8 oz (81.1 kg)  10/30/22 180 lb 9.6 oz (81.9 kg)     Objective:  Physical Exam Vitals and nursing note reviewed.  Constitutional:      Appearance: Normal appearance.  HENT:     Head: Normocephalic and atraumatic.  Eyes:     Extraocular Movements: Extraocular movements intact.  Cardiovascular:     Rate and Rhythm: Normal rate and regular rhythm.     Heart sounds: Normal heart sounds.  Pulmonary:     Effort: Pulmonary effort is normal.     Breath sounds: Normal breath sounds.  Musculoskeletal:     Cervical back: Normal range of motion.  Skin:    General: Skin is warm.  Neurological:     General: No focal deficit present.     Mental Status: She is alert.  Psychiatric:        Mood and Affect: Mood normal.        Behavior: Behavior normal.         Assessment And Plan:  Dyslipidemia associated with type 2 diabetes mellitus (HCC) Assessment & Plan: Chronic, LDL goal is less than 70.  She will continue with both rosuvastatin and semaglutide 1mg  weekly. She will rto in 3-4 months for re-evaluation. She is UTD w/ yearly eye exam.   Orders: -     Lipid panel -     Hemoglobin A1c -     BMP8+EGFR  Hypertensive heart disease without heart failure Assessment & Plan: Chronic, goal BP<120/80.  She will continue with valsartan/hct 80/12.5mg  daily. Encouraged to follow low sodium diet.   Orders: -     BMP8+EGFR  Atherosclerosis of aorta (HCC) Assessment & Plan: Chronic, LDL goal is less than 70.  She will continue with rosuvastatin 20mg  daily. Encouraged to follow heart healthy lifestyle.   Orders: -     Lipid panel  Sickle cell trait (HCC) Assessment & Plan: Chronic, currently asymptomatic. Encouraged to stay well hydrated.    Class 1 obesity due to excess calories with serious comorbidity and body mass index  (BMI) of 30.0 to 30.9 in adult Assessment & Plan: She is encouraged to strive for BMI less than 30 to decrease cardiac risk. Advised to aim for at least 150 minutes of exercise per week.    Other orders -     Rosuvastatin Calcium; Take 1 tablet (20 mg total) by mouth daily.  Dispense: 90 tablet; Refill: 2  She is encouraged to strive for BMI less than 30  to decrease cardiac risk. Advised to aim for at least 150 minutes of exercise per week.    Return for 4 MONTH DM F/U.Marland Kitchen  Patient was given opportunity to ask questions. Patient verbalized understanding of the plan and was able to repeat key elements of the plan. All questions were answered to their satisfaction.    I, [name], MD, have reviewed all documentation for this visit. The documentation on 03/01/23 for the exam, diagnosis, procedures, and orders are all accurate and complete.   IF YOU HAVE BEEN REFERRED TO A SPECIALIST, IT MAY TAKE 1-2 WEEKS TO SCHEDULE/PROCESS THE REFERRAL. IF YOU HAVE NOT HEARD FROM US/SPECIALIST IN TWO WEEKS, PLEASE GIVE [place] A CALL AT [phone].   THE PATIENT IS ENCOURAGED TO PRACTICE SOCIAL DISTANCING DUE TO THE COVID-19 PANDEMIC.

## 2023-03-02 LAB — LIPID PANEL
Chol/HDL Ratio: 2.5 ratio (ref 0.0–4.4)
Cholesterol, Total: 157 mg/dL (ref 100–199)
HDL: 64 mg/dL (ref >39)
LDL Chol Calc (NIH): 79 mg/dL (ref 0–99)
Triglycerides: 70 mg/dL (ref 0–149)
VLDL Cholesterol Cal: 14 mg/dL (ref 5–40)

## 2023-03-02 LAB — BMP8+EGFR
BUN/Creatinine Ratio: 16 (ref 9–23)
BUN: 10 mg/dL (ref 6–24)
CO2: 29 mmol/L (ref 20–29)
Calcium: 10 mg/dL (ref 8.7–10.2)
Chloride: 98 mmol/L (ref 96–106)
Creatinine, Ser: 0.63 mg/dL (ref 0.57–1.00)
Glucose: 83 mg/dL (ref 70–99)
Potassium: 4.2 mmol/L (ref 3.5–5.2)
Sodium: 139 mmol/L (ref 134–144)
eGFR: 104 mL/min/{1.73_m2} (ref >59)

## 2023-03-02 LAB — HEMOGLOBIN A1C
Est. average glucose Bld gHb Est-mCnc: 140 mg/dL
Hgb A1c MFr Bld: 6.5 % — ABNORMAL HIGH (ref 4.8–5.6)

## 2023-03-04 NOTE — Assessment & Plan Note (Signed)
Chronic, currently asymptomatic. Encouraged to stay well hydrated.

## 2023-03-04 NOTE — Assessment & Plan Note (Signed)
Chronic, goal BP<120/80.  She will continue with valsartan/hct 80/12.5mg  daily. Encouraged to follow low sodium diet.

## 2023-03-04 NOTE — Assessment & Plan Note (Signed)
Chronic, LDL goal is less than 70.  She will continue with rosuvastatin 20mg  daily. Encouraged to follow heart healthy lifestyle.

## 2023-03-04 NOTE — Assessment & Plan Note (Signed)
She is encouraged to strive for BMI less than 30 to decrease cardiac risk. Advised to aim for at least 150 minutes of exercise per week.

## 2023-03-04 NOTE — Assessment & Plan Note (Signed)
Chronic, LDL goal is less than 70.  She will continue with both rosuvastatin and semaglutide 1mg  weekly. She will rto in 3-4 months for re-evaluation. She is UTD w/ yearly eye exam.

## 2023-05-11 ENCOUNTER — Other Ambulatory Visit (HOSPITAL_COMMUNITY): Payer: Self-pay

## 2023-05-15 ENCOUNTER — Other Ambulatory Visit (HOSPITAL_COMMUNITY): Payer: Self-pay

## 2023-06-17 IMAGING — CT CT CARDIAC CORONARY ARTERY CALCIUM SCORE
3 series · 14 of 20 positions shown, 16 images · non-contrast
Comparison: None.
COMPARISON: None.

Addendum:
EXAM:
OVER-READ INTERPRETATION  CT CHEST

The following report is an over-read performed by radiologist Dr.
over-read does not include interpretation of cardiac or coronary
anatomy or pathology. The coronary calcium score interpretation by
the cardiologist is attached.
CLINICAL DATA: Cardiovascular Disease Risk stratification
Coronary Calcium Score
TECHNIQUE: A gated, non-contrast computed tomography scan of the heart was
performed using 3mm slice thickness. Axial images were analyzed on a
dedicated workstation. Calcium scoring of the coronary arteries was
performed using the Agatston method.

[Series 2: cascseq 2.0 sa36 (id) (id) · axial · 0.39mm/px · z∈[+1192,+1272]mm · 4 of 68 slices shown]
[im 14/68  vessel]
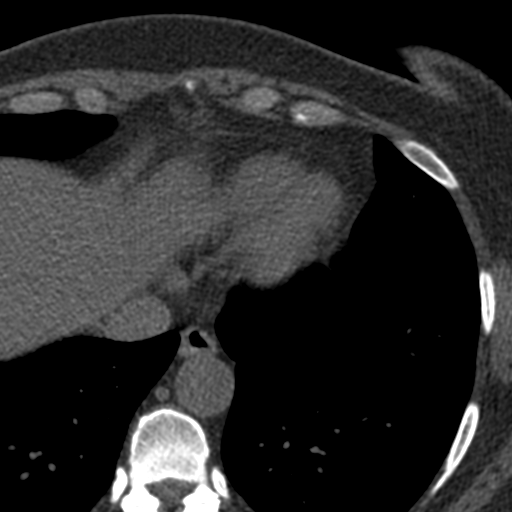
[im 27/68  vessel]
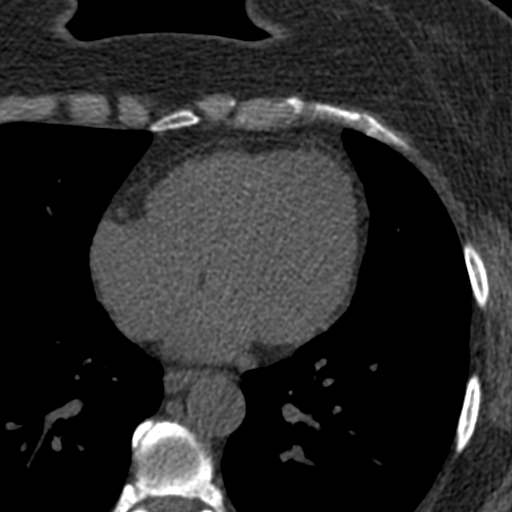
[im 41/68  vessel]
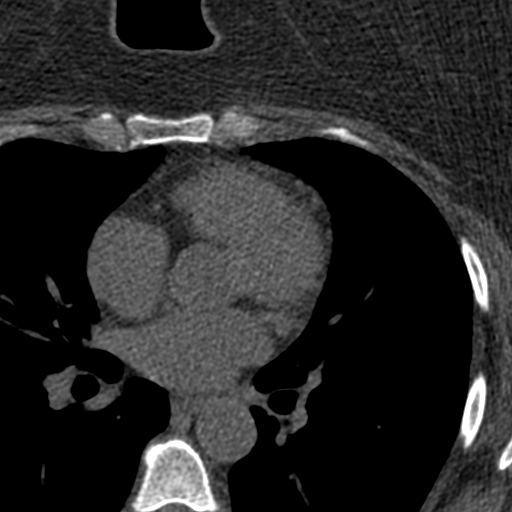
[im 54/68  vessel]
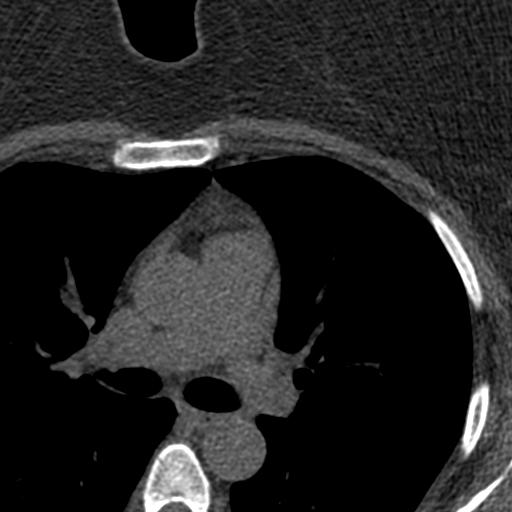

[Series 3: cascseq 2.0 bf37 st · axial · 0.70mm/px · z∈[+1188,+1276]mm · 5 of 68 slices shown, 7 images]
[im 12/68  vessel]
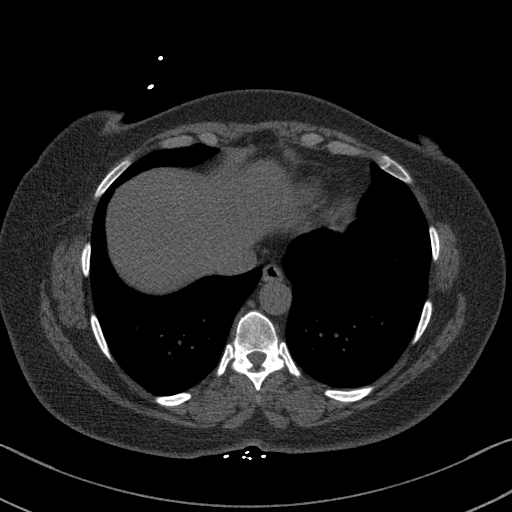
[im 12/68  lung]
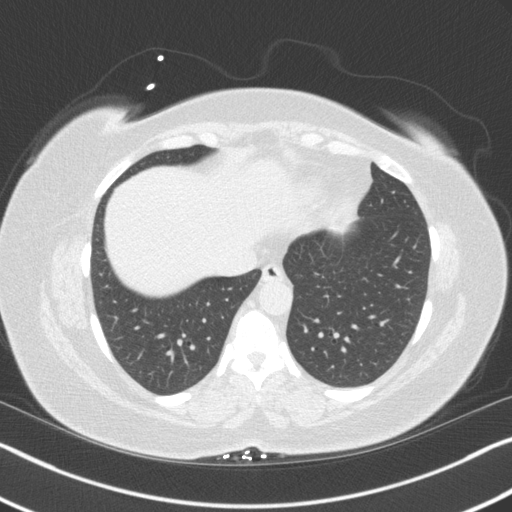
[im 23/68  vessel]
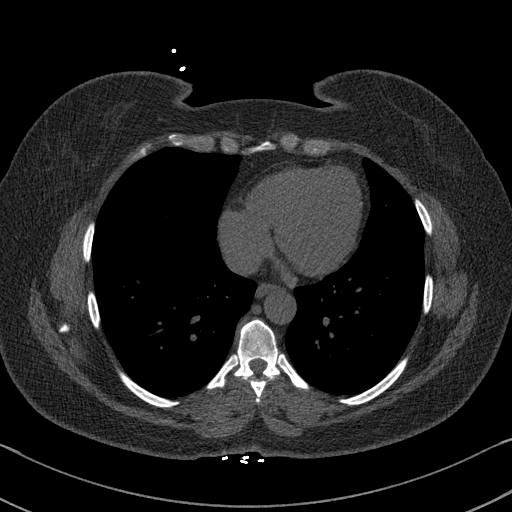
[im 34/68  vessel]
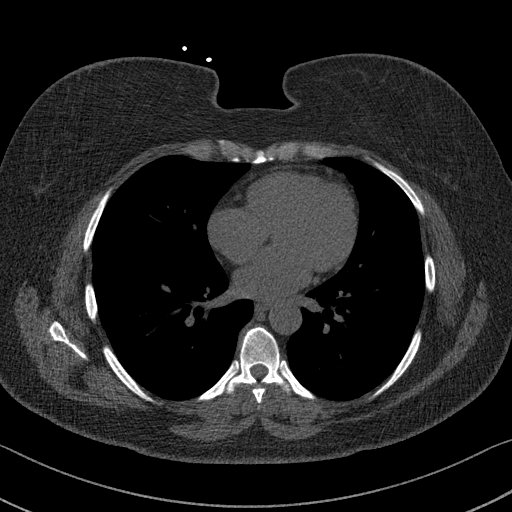
[im 45/68  vessel]
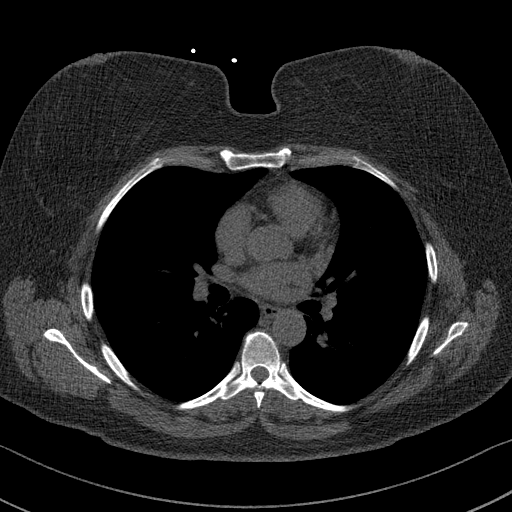
[im 56/68  vessel]
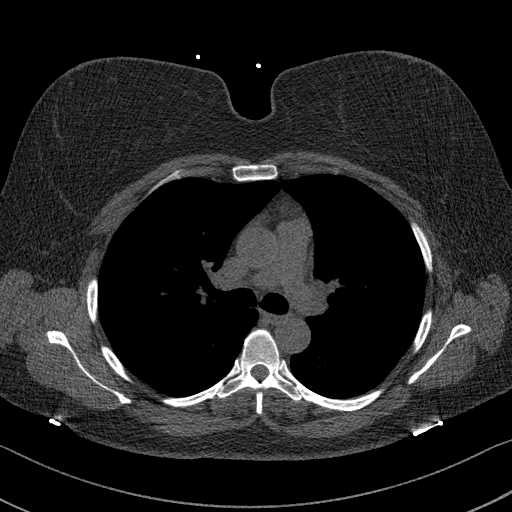
[im 56/68  lung]
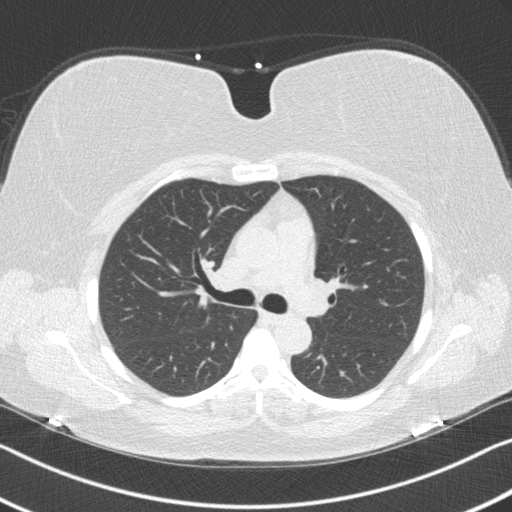

[Series 4: cascseq 2.0 br59 lung · axial · 0.70mm/px · z∈[+1188,+1276]mm · 5 of 68 slices shown]
[im 12/68  lung]
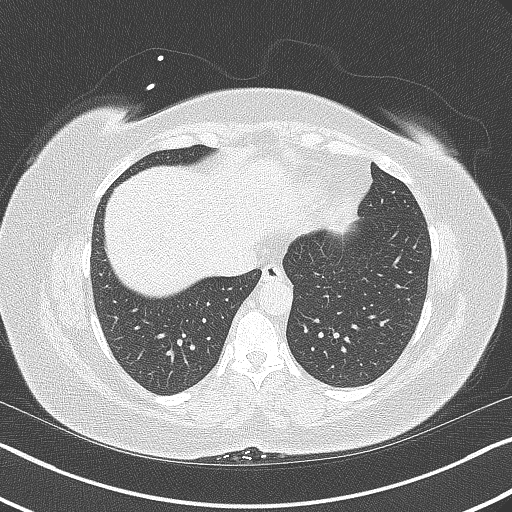
[im 23/68  lung]
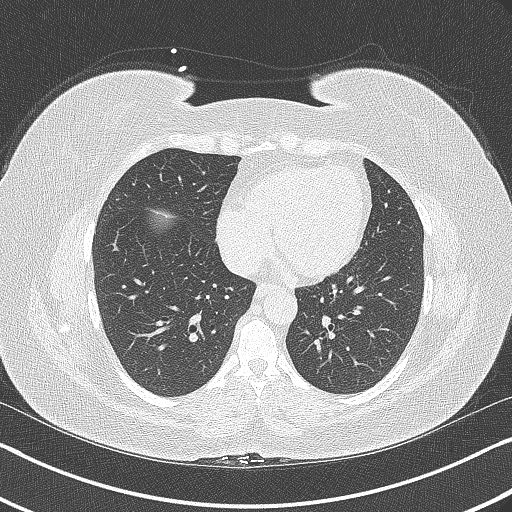
[im 34/68  lung]
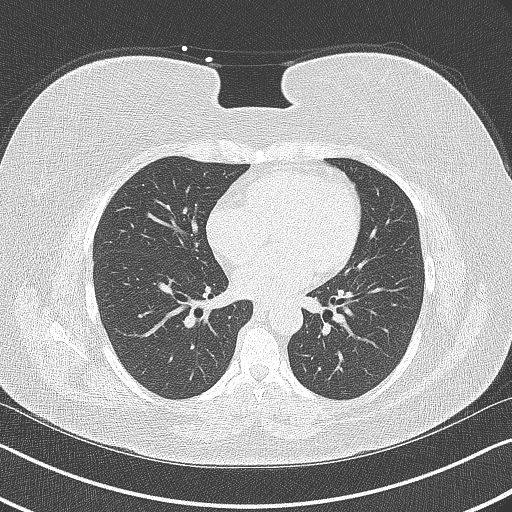
[im 45/68  lung]
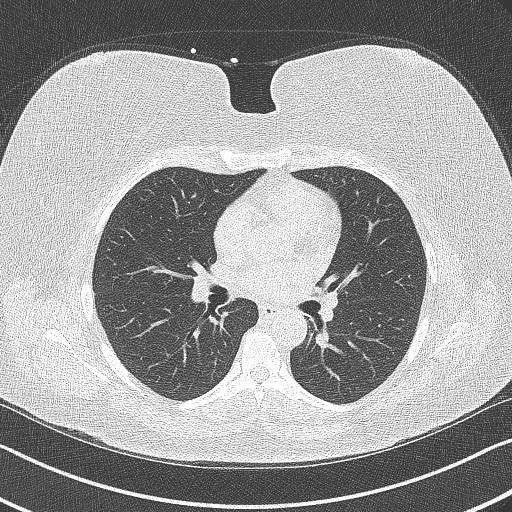
[im 56/68  lung]
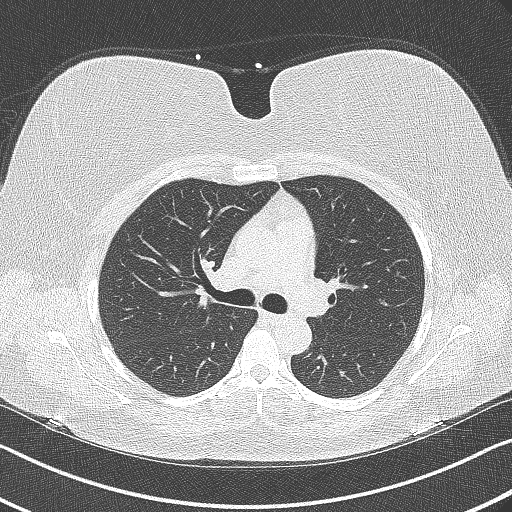

[14 of 20 positions shown; findings below may reference images not displayed]

FINDINGS: Vascular: No significant noncardiac vascular findings.

Mediastinum/Nodes: Visualized mediastinum and hilar regions
demonstrate no lymphadenopathy or masses.

Lungs/Pleura: There is no evidence of pulmonary edema,
consolidation, pneumothorax, nodule or pleural fluid.

Upper Abdomen: No acute abnormality.

Musculoskeletal: No chest wall mass or suspicious bone lesions
identified.
IMPRESSION: No significant incidental findings.
FINDINGS: Coronary arteries: Normal origins.

Coronary Calcium Score:

Left main: 0

Left anterior descending artery:

Left circumflex artery: 0

Right coronary artery: 0

Total:

Percentile: 80

Pericardium: Normal.

Ascending Aorta: Normal caliber. Small focal atherosclerosis of
aortic arch.

Non-cardiac: See separate report from [REDACTED].
IMPRESSION: 1. Coronary calcium score of 0.4. This was 80 percentile for age-,
race-, and sex-matched controls.

2.  Small focal atherosclerosis of aortic arch.



If CAC=0, it is reasonable to withhold statin therapy and reassess
in 5 to 10 years, as long as higher risk conditions are absent
(diabetes mellitus, family history of premature CHD in first degree
relatives (males <55 years; females <65 years), cigarette smoking,
or LDL >=190 mg/dL).

If CAC is 1 to 99, it is reasonable to initiate statin therapy for
patients >=55 years of age.

If CAC is >=100 or >=75th percentile, it is reasonable to initiate
statin therapy at any age.

Cardiology referral should be considered for patients with CAC
scores >=400 or >=75th percentile.

*9786 AHA/ACC/AACVPR/AAPA/ABC/TIGER/FLETES/BOOTS/Gianni/SOFOKLHS/ARBORIST/MAYABEL
Guideline on the Management of Blood Cholesterol: A Report of the
American College of Cardiology/American Heart Association Task Force
on Clinical Practice Guidelines. J Am Coll Cardiol.
4352;73(24):6617-6750.

*** End of Addendum ***
EXAM:
OVER-READ INTERPRETATION  CT CHEST

The following report is an over-read performed by radiologist Dr.
over-read does not include interpretation of cardiac or coronary
anatomy or pathology. The coronary calcium score interpretation by
the cardiologist is attached.
FINDINGS: Vascular: No significant noncardiac vascular findings.

Mediastinum/Nodes: Visualized mediastinum and hilar regions
demonstrate no lymphadenopathy or masses.

Lungs/Pleura: There is no evidence of pulmonary edema,
consolidation, pneumothorax, nodule or pleural fluid.

Upper Abdomen: No acute abnormality.

Musculoskeletal: No chest wall mass or suspicious bone lesions
identified.
IMPRESSION: No significant incidental findings.

## 2023-07-02 ENCOUNTER — Ambulatory Visit (INDEPENDENT_AMBULATORY_CARE_PROVIDER_SITE_OTHER): Payer: Commercial Managed Care - PPO | Admitting: Internal Medicine

## 2023-07-02 ENCOUNTER — Other Ambulatory Visit (HOSPITAL_COMMUNITY): Payer: Self-pay

## 2023-07-02 ENCOUNTER — Other Ambulatory Visit: Payer: Self-pay | Admitting: Internal Medicine

## 2023-07-02 ENCOUNTER — Other Ambulatory Visit: Payer: Self-pay

## 2023-07-02 VITALS — BP 122/78 | HR 75 | Temp 98.1°F | Ht 64.0 in | Wt 181.0 lb

## 2023-07-02 DIAGNOSIS — E6609 Other obesity due to excess calories: Secondary | ICD-10-CM | POA: Diagnosis not present

## 2023-07-02 DIAGNOSIS — I700 Atherosclerosis of aorta: Secondary | ICD-10-CM

## 2023-07-02 DIAGNOSIS — E1169 Type 2 diabetes mellitus with other specified complication: Secondary | ICD-10-CM

## 2023-07-02 DIAGNOSIS — E66811 Obesity, class 1: Secondary | ICD-10-CM | POA: Diagnosis not present

## 2023-07-02 DIAGNOSIS — I119 Hypertensive heart disease without heart failure: Secondary | ICD-10-CM | POA: Insufficient documentation

## 2023-07-02 DIAGNOSIS — Z6831 Body mass index (BMI) 31.0-31.9, adult: Secondary | ICD-10-CM | POA: Diagnosis not present

## 2023-07-02 DIAGNOSIS — D573 Sickle-cell trait: Secondary | ICD-10-CM | POA: Diagnosis not present

## 2023-07-02 DIAGNOSIS — E785 Hyperlipidemia, unspecified: Secondary | ICD-10-CM | POA: Diagnosis not present

## 2023-07-02 MED ORDER — OZEMPIC (1 MG/DOSE) 4 MG/3ML ~~LOC~~ SOPN
1.0000 mg | PEN_INJECTOR | SUBCUTANEOUS | 3 refills | Status: AC
Start: 2023-07-02 — End: ?
  Filled 2023-07-02: qty 3, 28d supply, fill #0
  Filled 2023-07-31: qty 3, 28d supply, fill #1
  Filled 2023-08-28: qty 3, 28d supply, fill #2
  Filled 2023-09-25: qty 3, 28d supply, fill #3

## 2023-07-02 NOTE — Assessment & Plan Note (Signed)
Chronic, LDL goal is less than 70.  She will continue with rosuvastatin 20mg  daily. Encouraged to follow heart healthy lifestyle.

## 2023-07-02 NOTE — Assessment & Plan Note (Signed)
 Chronic, LDL goal is less than 70.  She will continue with both rosuvastatin and semaglutide 1mg  weekly. She will rto in 3-4 months for re-evaluation. She is UTD w/ yearly eye exam.

## 2023-07-02 NOTE — Assessment & Plan Note (Signed)
 Chronic, currently asymptomatic. Encouraged to stay well hydrated.

## 2023-07-02 NOTE — Assessment & Plan Note (Signed)
 Chronic, well controlled.  She will continue with valsartan/hydrochlorothiazide 80/12.5mg  daily.  She is encouraged to follow low sodium diet.

## 2023-07-02 NOTE — Assessment & Plan Note (Signed)
 She is encouraged to strive for BMI less than 30 to decrease cardiac risk. Advised to aim for at least 150 minutes of exercise per week.

## 2023-07-02 NOTE — Patient Instructions (Signed)

## 2023-07-02 NOTE — Progress Notes (Signed)
 I,Victoria T Deloria Lair, CMA,acting as a Neurosurgeon for Sandra Aliment, MD.,have documented all relevant documentation on the behalf of Sandra Aliment, MD,as directed by  Sandra Aliment, MD while in the presence of Sandra Aliment, MD.  Subjective:  Patient ID: Sandra Sims , female    DOB: Nov 23, 1965 , 58 y.o.   MRN: [id number]  Chief Complaint  Patient presents with   Diabetes   Hypertension    HPI  Patient presents today for a DM/BP check.  She reports compliance with medications. She denies having any headaches, chest pain and shortness of breath.   She is now taking sour sop & black seed oil bitters. She has no specific concerns or complaints at this time.       Diabetes She presents for her follow-up diabetic visit. She has type 2 diabetes mellitus. Her disease course has been stable. Pertinent negatives for diabetes include no blurred vision. There are no hypoglycemic complications. Risk factors for coronary artery disease include diabetes mellitus, dyslipidemia, hypertension, obesity and post-menopausal. She is compliant with treatment most of the time. She is following a diabetic diet. She participates in exercise three times a week. An ACE inhibitor/angiotensin II receptor blocker is not being taken.  Hypertension This is a chronic problem. The current episode started more than 1 year ago. The problem has been gradually improving since onset. The problem is controlled. Pertinent negatives include no blurred vision. The current treatment provides moderate improvement. There are no compliance problems.      Past Medical History:  Diagnosis Date   Atherosclerosis of aorta (HCC) 05/20/2021   CAD in native artery 05/20/2021   Hypertension    PMB (postmenopausal bleeding)    Sickle cell trait (HCC)    Thickened endometrium    Type 2 diabetes mellitus (HCC)    followed by pcp   Uterine fibroid    Wears glasses      Family History  Problem Relation Age of Onset    Hypertension Mother    Colon polyps Mother    Stroke Father    Colon cancer Neg Hx    Esophageal cancer Neg Hx    Rectal cancer Neg Hx    Stomach cancer Neg Hx    Liver disease Neg Hx    Pancreatic cancer Neg Hx    Prostate cancer Neg Hx      Current Outpatient Medications:    cholecalciferol (VITAMIN D) 1000 units tablet, Take 1,000 Units by mouth daily., Disp: , Rfl:    cyclobenzaprine (FLEXERIL) 10 MG tablet, Take 1 tablet (10 mg total) by mouth at bedtime as needed., Disp: 30 tablet, Rfl: 0   ibuprofen (ADVIL,MOTRIN) 800 MG tablet, Take 800 mg by mouth every 8 (eight) hours as needed., Disp: , Rfl:    NON FORMULARY, Sea moss, Disp: , Rfl:    nystatin powder, Apply 1 Application topically 3 (three) times daily. as needed, Disp: 45 g, Rfl: 0   Omega-3 Fatty Acids (FISH OIL PO), Take 1,000 mg by mouth daily. , Disp: , Rfl:    OVER THE COUNTER MEDICATION, Apple cider vinegar 2 tsp daily, Disp: , Rfl:    rosuvastatin (CRESTOR) 20 MG tablet, Take 1 tablet (20 mg total) by mouth daily., Disp: 90 tablet, Rfl: 2   Semaglutide, 1 MG/DOSE, (OZEMPIC, 1 MG/DOSE,) 4 MG/3ML SOPN, Inject 1 mg into the skin once a week., Disp: 3 mL, Rfl: 3   valsartan-hydrochlorothiazide (DIOVAN HCT) 80-12.5 MG tablet, Take 1 tablet by  mouth daily., Disp: 30 tablet, Rfl: 11   vitamin C (ASCORBIC ACID) 500 MG tablet, Take 500 mg by mouth as needed. , Disp: , Rfl:    magnesium oxide (MAG-OX) 400 MG tablet, Take 400 mg by mouth as needed.  (Patient not taking: Reported on 03/01/2023), Disp: , Rfl:    mometasone (NASONEX) 50 MCG/ACT nasal spray, Place 2 sprays into the nose daily as needed. , Disp: , Rfl: 2   No Known Allergies   Review of Systems  Constitutional: Negative.   Eyes:  Negative for blurred vision.  Respiratory: Negative.    Cardiovascular: Negative.   Gastrointestinal: Negative.   Neurological: Negative.   Psychiatric/Behavioral: Negative.       Today's Vitals   07/02/23 0938  BP: 122/78   Pulse: 75  Temp: 98.1 F (36.7 C)  SpO2: 98%  Weight: 181 lb (82.1 kg)  Height: 5\' 4"  (1.626 m)   Body mass index is 31.07 kg/m.  Wt Readings from Last 3 Encounters:  07/02/23 181 lb (82.1 kg)  03/01/23 176 lb 6.4 oz (80 kg)  01/04/23 178 lb 12.8 oz (81.1 kg)     Objective:  Physical Exam Vitals and nursing note reviewed.  Constitutional:      Appearance: Normal appearance.  HENT:     Head: Normocephalic and atraumatic.  Eyes:     Extraocular Movements: Extraocular movements intact.  Cardiovascular:     Rate and Rhythm: Normal rate and regular rhythm.     Heart sounds: Normal heart sounds.  Pulmonary:     Effort: Pulmonary effort is normal.     Breath sounds: Normal breath sounds.  Musculoskeletal:     Cervical back: Normal range of motion.  Skin:    General: Skin is warm.  Neurological:     General: No focal deficit present.     Mental Status: She is alert.  Psychiatric:        Mood and Affect: Mood normal.        Behavior: Behavior normal.         Assessment And Plan:  Dyslipidemia associated with type 2 diabetes mellitus (HCC) Assessment & Plan: Chronic, LDL goal is less than 70.  She will continue with both rosuvastatin and semaglutide 1mg  weekly. She will rto in 3-4 months for re-evaluation. She is UTD w/ yearly eye exam.   Orders: -     CMP14+EGFR -     Hemoglobin A1c -     TSH -     Lipid panel  Hypertensive heart disease without heart failure Assessment & Plan: Chronic, well controlled.  She will continue with valsartan/hydrochlorothiazide 80/12.5mg  daily.  She is encouraged to follow low sodium diet.   Orders: -     CMP14+EGFR  Atherosclerosis of aorta (HCC) Assessment & Plan: Chronic, LDL goal is less than 70.  She will continue with rosuvastatin 20mg  daily. Encouraged to follow heart healthy lifestyle.    Sickle cell trait (HCC) Assessment & Plan: Chronic, currently asymptomatic. Encouraged to stay well hydrated.    Class 1 obesity  due to excess calories with serious comorbidity and body mass index (BMI) of 31.0 to 31.9 in adult Assessment & Plan: She is encouraged to strive for BMI less than 30 to decrease cardiac risk. Advised to aim for at least 150 minutes of exercise per week.    She is encouraged to strive for BMI less than 30 to decrease cardiac risk. Advised to aim for at least 150 minutes of exercise per week.  Return for 4 month dm f/u. Marland Kitchen  Patient was given opportunity to ask questions. Patient verbalized understanding of the plan and was able to repeat key elements of the plan. All questions were answered to their satisfaction.    I, Sandra Aliment, MD, have reviewed all documentation for this visit. The documentation on 07/02/23 for the exam, diagnosis, procedures, and orders are all accurate and complete.   IF YOU HAVE BEEN REFERRED TO A SPECIALIST, IT MAY TAKE 1-2 WEEKS TO SCHEDULE/PROCESS THE REFERRAL. IF YOU HAVE NOT HEARD FROM US/SPECIALIST IN TWO WEEKS, PLEASE GIVE [place] A CALL AT [phone].   THE PATIENT IS ENCOURAGED TO PRACTICE SOCIAL DISTANCING DUE TO THE COVID-19 PANDEMIC.

## 2023-07-03 LAB — LIPID PANEL
Chol/HDL Ratio: 2.5 ratio (ref 0.0–4.4)
Cholesterol, Total: 165 mg/dL (ref 100–199)
HDL: 65 mg/dL (ref >39)
LDL Chol Calc (NIH): 87 mg/dL (ref 0–99)
Triglycerides: 68 mg/dL (ref 0–149)
VLDL Cholesterol Cal: 13 mg/dL (ref 5–40)

## 2023-07-03 LAB — TSH: TSH: 1.62 u[IU]/mL (ref 0.450–4.500)

## 2023-07-03 LAB — CMP14+EGFR
ALT: 12 IU/L (ref 0–32)
AST: 16 IU/L (ref 0–40)
Albumin: 4.3 g/dL (ref 3.8–4.9)
Alkaline Phosphatase: 83 IU/L (ref 44–121)
BUN/Creatinine Ratio: 9 (ref 9–23)
BUN: 6 mg/dL (ref 6–24)
Bilirubin Total: 0.6 mg/dL (ref 0.0–1.2)
CO2: 26 mmol/L (ref 20–29)
Calcium: 9.6 mg/dL (ref 8.7–10.2)
Chloride: 103 mmol/L (ref 96–106)
Creatinine, Ser: 0.64 mg/dL (ref 0.57–1.00)
Globulin, Total: 2.7 g/dL (ref 1.5–4.5)
Glucose: 82 mg/dL (ref 70–99)
Potassium: 4 mmol/L (ref 3.5–5.2)
Sodium: 143 mmol/L (ref 134–144)
Total Protein: 7 g/dL (ref 6.0–8.5)
eGFR: 103 mL/min/{1.73_m2} (ref >59)

## 2023-07-03 LAB — HEMOGLOBIN A1C
Est. average glucose Bld gHb Est-mCnc: 134 mg/dL
Hgb A1c MFr Bld: 6.3 % — ABNORMAL HIGH (ref 4.8–5.6)

## 2023-07-04 ENCOUNTER — Other Ambulatory Visit (HOSPITAL_COMMUNITY): Payer: Self-pay

## 2023-07-31 ENCOUNTER — Other Ambulatory Visit: Payer: Self-pay | Admitting: Internal Medicine

## 2023-07-31 ENCOUNTER — Other Ambulatory Visit: Payer: Self-pay

## 2023-07-31 ENCOUNTER — Other Ambulatory Visit (HOSPITAL_COMMUNITY): Payer: Self-pay

## 2023-07-31 MED ORDER — VALSARTAN-HYDROCHLOROTHIAZIDE 80-12.5 MG PO TABS
1.0000 | ORAL_TABLET | Freq: Every day | ORAL | 11 refills | Status: AC
  Filled 2023-07-31: qty 30, 30d supply, fill #0
  Filled 2023-09-25: qty 30, 30d supply, fill #1
  Filled 2023-11-26: qty 30, 30d supply, fill #2
  Filled 2024-01-08: qty 30, 30d supply, fill #3
  Filled 2024-03-20: qty 30, 30d supply, fill #4
  Filled 2024-05-09: qty 30, 30d supply, fill #5

## 2023-09-13 ENCOUNTER — Other Ambulatory Visit: Payer: Self-pay | Admitting: Internal Medicine

## 2023-09-13 DIAGNOSIS — Z1231 Encounter for screening mammogram for malignant neoplasm of breast: Secondary | ICD-10-CM

## 2023-09-26 ENCOUNTER — Inpatient Hospital Stay: Admission: RE | Admit: 2023-09-26 | Source: Ambulatory Visit

## 2023-09-27 DIAGNOSIS — R319 Hematuria, unspecified: Secondary | ICD-10-CM | POA: Diagnosis not present

## 2023-09-27 DIAGNOSIS — Z01419 Encounter for gynecological examination (general) (routine) without abnormal findings: Secondary | ICD-10-CM | POA: Diagnosis not present

## 2023-09-27 DIAGNOSIS — N950 Postmenopausal bleeding: Secondary | ICD-10-CM | POA: Diagnosis not present

## 2023-09-27 DIAGNOSIS — I10 Essential (primary) hypertension: Secondary | ICD-10-CM | POA: Diagnosis not present

## 2023-09-27 DIAGNOSIS — Z780 Asymptomatic menopausal state: Secondary | ICD-10-CM | POA: Diagnosis not present

## 2023-09-27 DIAGNOSIS — E119 Type 2 diabetes mellitus without complications: Secondary | ICD-10-CM | POA: Diagnosis not present

## 2023-09-27 DIAGNOSIS — E7800 Pure hypercholesterolemia, unspecified: Secondary | ICD-10-CM | POA: Diagnosis not present

## 2023-09-27 LAB — HM PAP SMEAR

## 2023-10-04 ENCOUNTER — Ambulatory Visit
Admission: RE | Admit: 2023-10-04 | Discharge: 2023-10-04 | Disposition: A | Discharge status: Home / Self Care | Payer: Self-pay | Source: Ambulatory Visit | Attending: Internal Medicine | Admitting: Internal Medicine

## 2023-10-04 DIAGNOSIS — Z1231 Encounter for screening mammogram for malignant neoplasm of breast: Secondary | ICD-10-CM

## 2023-10-05 LAB — HM DIABETES EYE EXAM

## 2023-10-08 DIAGNOSIS — N950 Postmenopausal bleeding: Secondary | ICD-10-CM | POA: Diagnosis not present

## 2023-10-08 DIAGNOSIS — R9389 Abnormal findings on diagnostic imaging of other specified body structures: Secondary | ICD-10-CM | POA: Diagnosis not present

## 2023-10-08 DIAGNOSIS — D251 Intramural leiomyoma of uterus: Secondary | ICD-10-CM | POA: Diagnosis not present

## 2023-10-22 ENCOUNTER — Other Ambulatory Visit (HOSPITAL_COMMUNITY): Payer: Self-pay

## 2023-10-22 ENCOUNTER — Other Ambulatory Visit: Payer: Self-pay | Admitting: Internal Medicine

## 2023-10-22 ENCOUNTER — Other Ambulatory Visit: Payer: Self-pay

## 2023-10-22 MED ORDER — OZEMPIC (1 MG/DOSE) 4 MG/3ML ~~LOC~~ SOPN
1.0000 mg | PEN_INJECTOR | SUBCUTANEOUS | 3 refills | Status: DC
  Filled 2023-10-22: qty 3, 28d supply, fill #0
  Filled 2023-11-26: qty 3, 28d supply, fill #1
  Filled 2024-01-08: qty 3, 28d supply, fill #2
  Filled 2024-02-22: qty 3, 28d supply, fill #3

## 2023-10-23 ENCOUNTER — Other Ambulatory Visit: Payer: Self-pay | Admitting: Obstetrics and Gynecology

## 2023-10-23 DIAGNOSIS — N950 Postmenopausal bleeding: Secondary | ICD-10-CM | POA: Diagnosis not present

## 2023-10-23 DIAGNOSIS — N9489 Other specified conditions associated with female genital organs and menstrual cycle: Secondary | ICD-10-CM | POA: Diagnosis not present

## 2023-10-23 DIAGNOSIS — R9389 Abnormal findings on diagnostic imaging of other specified body structures: Secondary | ICD-10-CM | POA: Diagnosis not present

## 2023-10-31 NOTE — Progress Notes (Signed)
 I,Sandra Sims, CMA,acting as a Neurosurgeon for Sandra LOISE Slocumb, MD.,have documented all relevant documentation on the behalf of Sandra LOISE Slocumb, MD,as directed by  Sandra LOISE Slocumb, MD while in the presence of Sandra LOISE Slocumb, MD.  Subjective:    Patient ID: Sandra Sims , female    DOB: 19-Feb-1966 , 58 y.o.   MRN: [id number]  Chief Complaint  Patient presents with   Annual Exam    Patient presents today for annual exam. She reports compliance with medications. Denies headache, chest pain & sob. Letter sent to GYN for pap result.  GYN: Dr Rutherford    Diabetes   Hypertension    HPI Discussed the use of AI scribe software for clinical note transcription with the patient, who gave verbal consent to proceed.  History of Present Illness Sandra Sims is a 58 year old female who presents for a physical and diabetes check.  She does not regularly monitor her blood glucose levels. Her current medications include valsartan  80/12.5 mg, Ozempic  1 mg, and rosuvastatin  20 mg. She uses Nasonex nasal spray and magnesium as needed, and takes vitamin D3 daily. No issues with her medications.  She has a history of a cervical polyp discovered during a Pap smear last month. A hysteroscopy confirmed the presence of the polyp, and she is awaiting surgery for its removal next month.  She reports increased bruising, which she attributes to being clumsy and running into things. She is currently taking aspirin.  She does not perform regular breast self-exams. Her bowel movements are regular, occurring daily.  She recalls receiving the hepatitis B vaccine series in the past, but is unsure of the exact timing.   Diabetes She presents for her follow-up diabetic visit. She has type 2 diabetes mellitus. Her disease course has been stable. Pertinent negatives for diabetes include no blurred vision. There are no hypoglycemic complications. Risk factors for coronary artery disease include diabetes mellitus,  dyslipidemia, hypertension, obesity and post-menopausal. She is compliant with treatment most of the time. She is following a diabetic diet. She participates in exercise three times a week. An ACE inhibitor/angiotensin II receptor blocker is not being taken.  Hypertension This is a chronic problem. The current episode started more than 1 year ago. The problem has been gradually improving since onset. The problem is controlled. Pertinent negatives include no blurred vision. The current treatment provides moderate improvement. There are no compliance problems.      Past Medical History:  Diagnosis Date   Atherosclerosis of aorta (HCC) 05/20/2021   per CTC 05-13-2021   CAD in native artery 05/20/2021   cardiology ---  dr t. 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   sleep study in epic done by dr dohmeier 01-11-2017  mild OSA AHI 5.5 insig degree to indicate cpap   PMB (postmenopausal bleeding)    Seasonal allergies    Sickle cell trait (HCC)    Type 2 diabetes mellitus (HCC)    followed by pcp:   (11-06-2023  pt stated does not check blood sugar)   Uterine fibroid    Wears glasses      Family History  Problem Relation Age of Onset   Hypertension Mother    Colon polyps Mother  Stroke Father    Colon cancer Neg Hx    Esophageal cancer Neg Hx    Rectal cancer Neg Hx    Stomach cancer Neg Hx    Liver disease Neg Hx    Pancreatic cancer Neg Hx    Prostate cancer Neg Hx    BRCA 1/2 Neg Hx    Breast cancer Neg Hx      Current Outpatient Medications:    cholecalciferol (VITAMIN D ) 1000 units tablet, Take 1,000 Units by mouth daily., Disp: , Rfl:    cyclobenzaprine  (FLEXERIL ) 10 MG tablet, Take 1 tablet (10 mg total) by mouth at bedtime as needed., Disp: 30 tablet, Rfl: 0   ibuprofen  (ADVIL,MOTRIN) 800 MG tablet, Take 800 mg by mouth every 8 (eight) hours as needed., Disp: , Rfl:    mometasone (NASONEX) 50 MCG/ACT nasal spray, Place 2 sprays into the nose daily as needed. , Disp: , Rfl: 2   nystatin  powder, Apply 1 Application topically 3 (three) times daily. as needed (Patient taking differently: Apply 1 Application topically 3 (three) times daily as needed.), Disp: 45 g, Rfl: 0   Omega-3 Fatty Acids (FISH OIL PO), Take 1,000 mg by mouth daily. , Disp: , Rfl:    OVER THE COUNTER MEDICATION, Take by mouth daily. Apple cider vinegar 2 tsp daily in glass of water, Disp: , Rfl:    rosuvastatin  (CRESTOR ) 20 MG tablet, Take 1 tablet (20 mg total) by mouth daily. (Patient taking differently: Take 20 mg by mouth daily.), Disp: 90 tablet, Rfl: 2   Semaglutide , 1 MG/DOSE, (OZEMPIC , 1 MG/DOSE,) 4 MG/3ML SOPN, Inject 1 mg into the skin once a week. (Patient taking differently: Inject 1 mg into the skin once a week. Wednesday), Disp: 3 mL, Rfl: 3   valsartan -hydrochlorothiazide  (DIOVAN  HCT) 80-12.5 MG tablet, Take 1 tablet by mouth daily. (Patient taking differently: Take 1 tablet by mouth daily.), Disp: 30 tablet, Rfl: 11   vitamin C (ASCORBIC ACID) 500 MG tablet, Take 500 mg by mouth daily., Disp: , Rfl:    aspirin 81 MG chewable tablet, Chew 81 mg by mouth daily., Disp: , Rfl:    misoprostol  (CYTOTEC ) 200 MCG tablet, Place 1 tablet (200 mcg total) vaginally 2 hours before surgery., Disp: 1 tablet, Rfl: 0   No Known Allergies    The patient states she uses none for birth control. No LMP recorded (lmp Sandra). Patient is postmenopausal.. Negative for Dysmenorrhea. Negative for: breast discharge, breast lump(s), breast pain and breast self exam. Associated symptoms include abnormal vaginal bleeding. Pertinent negatives include abnormal bleeding (hematology), anxiety, decreased libido, depression, difficulty falling sleep, dyspareunia, history of infertility, nocturia, sexual dysfunction,  sleep disturbances, urinary incontinence, urinary urgency, vaginal discharge and vaginal itching. Diet regular.The patient states her exercise level is  moderate.  . The patient's tobacco use is:  Social History   Tobacco Use  Smoking Status Former   Current packs/day: 0.00   Average packs/day: 0.5 packs/day for 11.0 years (5.5 ttl pk-yrs)   Types: Cigarettes   Start date: 02/06/1984   Quit date: 02/06/1995   Years since quitting: 28.7  Smokeless Tobacco Never  . She has been exposed to passive smoke. The patient's alcohol use is:  Social History   Substance and Sexual Activity  Alcohol Use No    Review of Systems  Constitutional: Negative.   HENT: Negative.    Eyes: Negative.  Negative for blurred vision.  Respiratory: Negative.    Cardiovascular: Negative.   Gastrointestinal: Negative.  Endocrine: Negative.   Genitourinary: Negative.   Musculoskeletal: Negative.   Skin: Negative.   Allergic/Immunologic: Negative.   Neurological: Negative.   Hematological: Negative.   Psychiatric/Behavioral: Negative.       Today's Vitals   11/01/23 0945  BP: 110/80  Pulse: 89  Temp: 98.1 F (36.7 C)  SpO2: 98%  Weight: 184 lb 6.4 oz (83.6 kg)  Height: 5' 4 (1.626 m)   Body mass index is 31.65 kg/m.  Wt Readings from Last 3 Encounters:  11/01/23 184 lb 6.4 oz (83.6 kg)  07/02/23 181 lb (82.1 kg)  03/01/23 176 lb 6.4 oz (80 kg)     Objective:  Physical Exam Vitals and nursing note reviewed.  Constitutional:      Appearance: Normal appearance.  HENT:     Head: Normocephalic and atraumatic.     Right Ear: Tympanic membrane, ear canal and external ear normal.     Left Ear: Tympanic membrane, ear canal and external ear normal.     Nose: Nose normal.     Mouth/Throat:     Mouth: Mucous membranes are moist.     Pharynx: Oropharynx is clear.  Eyes:     Extraocular Movements: Extraocular movements intact.     Conjunctiva/sclera: Conjunctivae normal.     Pupils: Pupils  are equal, round, and reactive to light.  Cardiovascular:     Rate and Rhythm: Normal rate and regular rhythm.     Pulses:          Dorsalis pedis pulses are 3+ on the right side and 3+ on the left side.     Heart sounds: Normal heart sounds.  Pulmonary:     Effort: Pulmonary effort is normal.     Breath sounds: Normal breath sounds.  Chest:     Comments: Hyperpigmentation beneath both breasts Abdominal:     General: Abdomen is flat. Bowel sounds are normal.     Palpations: Abdomen is soft.  Genitourinary:    Comments: deferred Musculoskeletal:        General: Normal range of motion.     Cervical back: Normal range of motion and neck supple.  Feet:     Right foot:     Protective Sensation: 5 sites tested.  5 sites sensed.     Skin integrity: Skin integrity normal.     Toenail Condition: Right toenails are normal.     Left foot:     Protective Sensation: 5 sites tested.  5 sites sensed.     Skin integrity: Skin integrity normal.     Toenail Condition: Left toenails are normal.  Skin:    General: Skin is warm and dry.  Neurological:     General: No focal deficit present.     Mental Status: She is alert and oriented to person, place, and time.  Psychiatric:        Mood and Affect: Mood normal.        Behavior: Behavior normal.     Assessment And Plan:     Routine general medical examination at health care facility Assessment & Plan: A full exam was performed.  Importance of monthly self breast exams was discussed with the patient.  She is advised to get 30-45 minutes of regular exercise, no less than four to five days per week. Both weight-bearing and aerobic exercises are recommended.  She is advised to follow a healthy diet with at least six fruits/veggies per day, decrease intake of red meat and other saturated fats and to  increase fish intake to twice weekly.  Meats/fish should not be fried -- baked, boiled or broiled is preferable. It is also important to cut back on your  sugar intake.  Be sure to read labels - try to avoid anything with added sugar, high fructose corn syrup or other sweeteners.  If you must use a sweetener, you can try stevia or monkfruit.  It is also important to avoid artificially sweetened foods/beverages and diet drinks. Lastly, wear SPF 50 sunscreen on exposed skin and when in direct sunlight for an extended period of time.  Be sure to avoid fast food restaurants and aim for at least 60 ounces of water daily.      Orders: -     CMP14+EGFR -     CBC -     Hepatitis B surface antibody,qualitative  Dyslipidemia associated with type 2 diabetes mellitus (HCC) Assessment & Plan: Chronic, LDL goal is less than 70.  She will continue with both rosuvastatin  and semaglutide  1mg  weekly. She will rto in 3-4 months for re-evaluation. She is UTD w/ yearly eye exam.   Orders: -     POCT URINALYSIS DIP (CLINITEK) -     EKG 12-Lead -     Microalbumin / creatinine urine ratio -     Hemoglobin A1c  Hypertensive heart disease without heart failure Assessment & Plan: Chronic, controlled. EKG performed, NSR w/o new changes.  She will continue with valsartan /hct.  - Follow low sodium diet - Recheck in four months.     Atherosclerosis of aorta (HCC) Assessment & Plan: Chronic, LDL goal is less than 70.  She will continue with rosuvastatin  20mg  daily. Encouraged to follow heart healthy lifestyle.    Cervical polyp Assessment & Plan: Confirmed via hysteroscopy. Awaiting surgical removal next month. - She is followed by Dr. Rutherford   Vitamin D  deficiency disease -     VITAMIN D  25 Hydroxy (Vit-D Deficiency, Fractures)  Class 1 obesity due to excess calories with serious comorbidity and body mass index (BMI) of 31.0 to 31.9 in adult Assessment & Plan: She is encouraged to strive for BMI less than 30 to decrease cardiac risk. Advised to aim for at least 150 minutes of exercise per week.     Return for 1 year physical, 4 month DM/chol  check. Patient was given opportunity to ask questions. Patient verbalized understanding of the plan and was able to repeat key elements of the plan. All questions were answered to their satisfaction.   I, Sandra LOISE Slocumb, MD, have reviewed all documentation for this visit. The documentation on 11/01/23 for the exam, diagnosis, procedures, and orders are all accurate and complete.

## 2023-10-31 NOTE — Patient Instructions (Incomplete)

## 2023-11-01 ENCOUNTER — Ambulatory Visit (INDEPENDENT_AMBULATORY_CARE_PROVIDER_SITE_OTHER): Payer: 59 | Admitting: Internal Medicine

## 2023-11-01 ENCOUNTER — Encounter: Payer: Self-pay | Admitting: Internal Medicine

## 2023-11-01 VITALS — BP 110/80 | HR 89 | Temp 98.1°F | Ht 64.0 in | Wt 184.4 lb

## 2023-11-01 DIAGNOSIS — E559 Vitamin D deficiency, unspecified: Secondary | ICD-10-CM | POA: Diagnosis not present

## 2023-11-01 DIAGNOSIS — Z6831 Body mass index (BMI) 31.0-31.9, adult: Secondary | ICD-10-CM | POA: Diagnosis not present

## 2023-11-01 DIAGNOSIS — I700 Atherosclerosis of aorta: Secondary | ICD-10-CM | POA: Diagnosis not present

## 2023-11-01 DIAGNOSIS — E6609 Other obesity due to excess calories: Secondary | ICD-10-CM

## 2023-11-01 DIAGNOSIS — I119 Hypertensive heart disease without heart failure: Secondary | ICD-10-CM

## 2023-11-01 DIAGNOSIS — E1169 Type 2 diabetes mellitus with other specified complication: Secondary | ICD-10-CM

## 2023-11-01 DIAGNOSIS — Z0000 Encounter for general adult medical examination without abnormal findings: Secondary | ICD-10-CM

## 2023-11-01 DIAGNOSIS — E66811 Obesity, class 1: Secondary | ICD-10-CM | POA: Diagnosis not present

## 2023-11-01 DIAGNOSIS — D573 Sickle-cell trait: Secondary | ICD-10-CM

## 2023-11-01 DIAGNOSIS — N841 Polyp of cervix uteri: Secondary | ICD-10-CM | POA: Diagnosis not present

## 2023-11-01 DIAGNOSIS — E785 Hyperlipidemia, unspecified: Secondary | ICD-10-CM

## 2023-11-01 LAB — POCT URINALYSIS DIP (CLINITEK)
Bilirubin, UA: NEGATIVE
Blood, UA: NEGATIVE
Glucose, UA: NEGATIVE mg/dL
Ketones, POC UA: NEGATIVE mg/dL
Leukocytes, UA: NEGATIVE
Nitrite, UA: NEGATIVE
POC PROTEIN,UA: NEGATIVE
Spec Grav, UA: 1.02 (ref 1.010–1.025)
Urobilinogen, UA: 0.2 U/dL
pH, UA: 6 (ref 5.0–8.0)

## 2023-11-01 NOTE — Assessment & Plan Note (Signed)
Chronic, LDL goal is less than 70.  She will continue with rosuvastatin 20mg  daily. Encouraged to follow heart healthy lifestyle.

## 2023-11-01 NOTE — Assessment & Plan Note (Signed)
 She is encouraged to strive for BMI less than 30 to decrease cardiac risk. Advised to aim for at least 150 minutes of exercise per week.

## 2023-11-01 NOTE — Assessment & Plan Note (Signed)
 Chronic, LDL goal is less than 70.  She will continue with both rosuvastatin and semaglutide 1mg  weekly. She will rto in 3-4 months for re-evaluation. She is UTD w/ yearly eye exam.

## 2023-11-01 NOTE — Assessment & Plan Note (Signed)

## 2023-11-01 NOTE — Assessment & Plan Note (Signed)
 Chronic, controlled. EKG performed, NSR w/o new changes.  She will continue with valsartan /hct.  - Follow low sodium diet - Recheck in four months.

## 2023-11-02 LAB — CMP14+EGFR
ALT: 15 IU/L (ref 0–32)
AST: 18 IU/L (ref 0–40)
Albumin: 4.7 g/dL (ref 3.8–4.9)
Alkaline Phosphatase: 85 IU/L (ref 44–121)
BUN/Creatinine Ratio: 15 (ref 9–23)
BUN: 11 mg/dL (ref 6–24)
Bilirubin Total: 0.7 mg/dL (ref 0.0–1.2)
CO2: 24 mmol/L (ref 20–29)
Calcium: 9.9 mg/dL (ref 8.7–10.2)
Chloride: 97 mmol/L (ref 96–106)
Creatinine, Ser: 0.75 mg/dL (ref 0.57–1.00)
Globulin, Total: 3.1 g/dL (ref 1.5–4.5)
Glucose: 81 mg/dL (ref 70–99)
Potassium: 4.2 mmol/L (ref 3.5–5.2)
Sodium: 139 mmol/L (ref 134–144)
Total Protein: 7.8 g/dL (ref 6.0–8.5)
eGFR: 93 mL/min/{1.73_m2} (ref >59)

## 2023-11-02 LAB — CBC
Hematocrit: 44 % (ref 34.0–46.6)
Hemoglobin: 14.1 g/dL (ref 11.1–15.9)
MCH: 26.5 pg — ABNORMAL LOW (ref 26.6–33.0)
MCHC: 32 g/dL (ref 31.5–35.7)
MCV: 83 fL (ref 79–97)
Platelets: 328 10*3/uL (ref 150–450)
RBC: 5.33 x10E6/uL — ABNORMAL HIGH (ref 3.77–5.28)
RDW: 14.1 % (ref 11.7–15.4)
WBC: 4.6 10*3/uL (ref 3.4–10.8)

## 2023-11-02 LAB — HEMOGLOBIN A1C
Est. average glucose Bld gHb Est-mCnc: 134 mg/dL
Hgb A1c MFr Bld: 6.3 % — ABNORMAL HIGH (ref 4.8–5.6)

## 2023-11-02 LAB — VITAMIN D 25 HYDROXY (VIT D DEFICIENCY, FRACTURES): Vit D, 25-Hydroxy: 87.5 ng/mL (ref 30.0–100.0)

## 2023-11-02 LAB — MICROALBUMIN / CREATININE URINE RATIO
Creatinine, Urine: 94 mg/dL
Microalb/Creat Ratio: 6 mg/g{creat} (ref 0–29)
Microalbumin, Urine: 6 ug/mL

## 2023-11-02 LAB — HEPATITIS B SURFACE ANTIBODY,QUALITATIVE: Hep B Surface Ab, Qual: NONREACTIVE

## 2023-11-04 ENCOUNTER — Ambulatory Visit: Payer: Self-pay | Admitting: Internal Medicine

## 2023-11-06 ENCOUNTER — Encounter (HOSPITAL_COMMUNITY): Payer: Self-pay | Admitting: Obstetrics and Gynecology

## 2023-11-06 ENCOUNTER — Other Ambulatory Visit (HOSPITAL_COMMUNITY): Payer: Self-pay

## 2023-11-06 MED ORDER — MISOPROSTOL 200 MCG PO TABS
200.0000 ug | ORAL_TABLET | ORAL | 0 refills | Status: DC
  Filled 2023-11-06: qty 1, 1d supply, fill #0

## 2023-11-06 NOTE — Progress Notes (Signed)
 Spoke w/ via phone for pre-op interview--- pt Lab needs dos---- cbc        Lab results------ current CMP result dated 11-01-2023 in epic/  current EKG in epic/ chart COVID test -----patient states asymptomatic no test needed Arrive at ------- 1030 on 11-13-2023 NPO after MN NO Solid Food.  Clear liquids from MN until--- 0930 Pre-Surgery Ensure or G2:  n/a  Med rec completed Medications to take morning of surgery -----  none Diabetic medication ----- n/a  GLP1 agonist last dose:  pt stated last 10-31-2023 GLP1 instructions:  pt stated was not given instruction about not doing injection week prior to surgery.  Pt told of guideline for GLP1 and reason, pt verbalized understanding and will not to injection tomorrow 11-07-2023 and wait until after surgery  Patient instructed no nail polish to be worn day of surgery Patient instructed to bring photo id and insurance card day of surgery Patient aware to have Driver (ride ) / caregiver    for 24 hours after surgery - husband, Armed forces training and education officer Patient Special Instructions ----- antibacterial soap shower morning of surgery Pre-Op special Instructions ----- n/a  Patient verbalized understanding of instructions that were given at this phone interview. Patient denies chest pain, sob, fever, cough at the interview.

## 2023-11-10 DIAGNOSIS — N841 Polyp of cervix uteri: Secondary | ICD-10-CM | POA: Insufficient documentation

## 2023-11-10 NOTE — Assessment & Plan Note (Signed)
 Confirmed via hysteroscopy. Awaiting surgical removal next month. - She is followed by Dr. Rutherford

## 2023-11-13 ENCOUNTER — Encounter (HOSPITAL_COMMUNITY): Payer: Self-pay | Admitting: Obstetrics and Gynecology

## 2023-11-13 ENCOUNTER — Ambulatory Visit (HOSPITAL_COMMUNITY)
Admission: RE | Admit: 2023-11-13 | Discharge: 2023-11-13 | Disposition: A | Discharge status: Home / Self Care | Attending: Obstetrics and Gynecology | Admitting: Obstetrics and Gynecology

## 2023-11-13 ENCOUNTER — Ambulatory Visit (HOSPITAL_COMMUNITY)

## 2023-11-13 ENCOUNTER — Encounter (HOSPITAL_COMMUNITY)
Admission: RE | Disposition: A | Discharge status: Home / Self Care | Payer: Self-pay | Source: Home / Self Care | Attending: Obstetrics and Gynecology

## 2023-11-13 ENCOUNTER — Other Ambulatory Visit: Payer: Self-pay

## 2023-11-13 DIAGNOSIS — D250 Submucous leiomyoma of uterus: Secondary | ICD-10-CM | POA: Insufficient documentation

## 2023-11-13 DIAGNOSIS — N950 Postmenopausal bleeding: Secondary | ICD-10-CM | POA: Diagnosis not present

## 2023-11-13 DIAGNOSIS — Z87891 Personal history of nicotine dependence: Secondary | ICD-10-CM | POA: Diagnosis not present

## 2023-11-13 DIAGNOSIS — Z01818 Encounter for other preprocedural examination: Secondary | ICD-10-CM

## 2023-11-13 DIAGNOSIS — I2510 Atherosclerotic heart disease of native coronary artery without angina pectoris: Secondary | ICD-10-CM | POA: Diagnosis not present

## 2023-11-13 DIAGNOSIS — Z79899 Other long term (current) drug therapy: Secondary | ICD-10-CM | POA: Diagnosis not present

## 2023-11-13 DIAGNOSIS — I10 Essential (primary) hypertension: Secondary | ICD-10-CM | POA: Insufficient documentation

## 2023-11-13 DIAGNOSIS — G4733 Obstructive sleep apnea (adult) (pediatric): Secondary | ICD-10-CM | POA: Diagnosis not present

## 2023-11-13 DIAGNOSIS — D259 Leiomyoma of uterus, unspecified: Secondary | ICD-10-CM | POA: Diagnosis not present

## 2023-11-13 DIAGNOSIS — E119 Type 2 diabetes mellitus without complications: Secondary | ICD-10-CM | POA: Diagnosis not present

## 2023-11-13 DIAGNOSIS — Z7985 Long-term (current) use of injectable non-insulin antidiabetic drugs: Secondary | ICD-10-CM | POA: Insufficient documentation

## 2023-11-13 HISTORY — DX: Other seasonal allergic rhinitis: J30.2

## 2023-11-13 HISTORY — PX: MYOSURE RESECTION: SHX7611

## 2023-11-13 HISTORY — DX: Other specified conditions associated with female genital organs and menstrual cycle: N94.89

## 2023-11-13 HISTORY — DX: Hyperlipidemia, unspecified: E78.5

## 2023-11-13 HISTORY — DX: Personal history of cervical dysplasia: Z87.410

## 2023-11-13 HISTORY — PX: DILATATION & CURRETTAGE/HYSTEROSCOPY WITH RESECTOCOPE: SHX5572

## 2023-11-13 LAB — CBC
HCT: 41.4 % (ref 36.0–46.0)
Hemoglobin: 13.5 g/dL (ref 12.0–15.0)
MCH: 25.6 pg — ABNORMAL LOW (ref 26.0–34.0)
MCHC: 32.6 g/dL (ref 30.0–36.0)
MCV: 78.4 fL — ABNORMAL LOW (ref 80.0–100.0)
Platelets: 285 K/uL (ref 150–400)
RBC: 5.28 MIL/uL — ABNORMAL HIGH (ref 3.87–5.11)
RDW: 14.1 % (ref 11.5–15.5)
WBC: 4 K/uL (ref 4.0–10.5)
nRBC: 0 % (ref 0.0–0.2)

## 2023-11-13 LAB — GLUCOSE, CAPILLARY
Glucose-Capillary: 82 mg/dL (ref 70–99)
Glucose-Capillary: 66 mg/dL — ABNORMAL LOW (ref 70–99)
Glucose-Capillary: 79 mg/dL (ref 70–99)

## 2023-11-13 SURGERY — DILATATION & CURETTAGE/HYSTEROSCOPY WITH RESECTOCOPE
Anesthesia: General | Site: Vagina

## 2023-11-13 MED ORDER — HYDROMORPHONE HCL 1 MG/ML IJ SOLN
INTRAMUSCULAR | Status: AC
  Filled 2023-11-13: qty 1

## 2023-11-13 MED ORDER — KETOROLAC TROMETHAMINE 15 MG/ML IJ SOLN
INTRAMUSCULAR | Status: DC | PRN
Start: 2023-11-13 — End: 2023-11-13
  Administered 2023-11-13: 15 mg via INTRAVENOUS

## 2023-11-13 MED ORDER — ORAL CARE MOUTH RINSE: 15.0000 mL | Freq: Once | OROMUCOSAL | Status: AC

## 2023-11-13 MED ORDER — CHLORHEXIDINE GLUCONATE 0.12 % MT SOLN: 15.0000 mL | Freq: Once | OROMUCOSAL | Status: AC

## 2023-11-13 MED ORDER — CHLORHEXIDINE GLUCONATE 0.12 % MT SOLN
OROMUCOSAL | Status: AC
  Administered 2023-11-13: 15 mL via OROMUCOSAL
  Filled 2023-11-13: qty 15

## 2023-11-13 MED ORDER — FENTANYL CITRATE (PF) 250 MCG/5ML IJ SOLN
INTRAMUSCULAR | Status: AC
  Filled 2023-11-13: qty 5

## 2023-11-13 MED ORDER — ONDANSETRON HCL 4 MG/2ML IJ SOLN
INTRAMUSCULAR | Status: DC | PRN
  Administered 2023-11-13: 4 mg via INTRAVENOUS

## 2023-11-13 MED ORDER — DEXAMETHASONE SODIUM PHOSPHATE 10 MG/ML IJ SOLN
INTRAMUSCULAR | Status: AC
  Filled 2023-11-13: qty 1

## 2023-11-13 MED ORDER — DEXAMETHASONE SODIUM PHOSPHATE 10 MG/ML IJ SOLN
INTRAMUSCULAR | Status: DC | PRN
  Administered 2023-11-13: 5 mg via INTRAVENOUS

## 2023-11-13 MED ORDER — KETOROLAC TROMETHAMINE 30 MG/ML IJ SOLN
INTRAMUSCULAR | Status: AC
  Filled 2023-11-13: qty 1

## 2023-11-13 MED ORDER — PROPOFOL 10 MG/ML IV BOLUS
INTRAVENOUS | Status: AC
  Filled 2023-11-13: qty 20

## 2023-11-13 MED ORDER — POVIDONE-IODINE 10 % EX SWAB: 2.0000 | Freq: Once | CUTANEOUS | Status: DC

## 2023-11-13 MED ORDER — MIDAZOLAM HCL 2 MG/2ML IJ SOLN
INTRAMUSCULAR | Status: DC | PRN
  Administered 2023-11-13: 2 mg via INTRAVENOUS

## 2023-11-13 MED ORDER — OXYCODONE HCL 5 MG/5ML PO SOLN: 5.0000 mg | Freq: Once | ORAL | Status: DC | PRN

## 2023-11-13 MED ORDER — FENTANYL CITRATE (PF) 250 MCG/5ML IJ SOLN
INTRAMUSCULAR | Status: DC | PRN
  Administered 2023-11-13: 25 ug via INTRAVENOUS

## 2023-11-13 MED ORDER — ONDANSETRON HCL 4 MG/2ML IJ SOLN
INTRAMUSCULAR | Status: AC
  Filled 2023-11-13: qty 2

## 2023-11-13 MED ORDER — DROPERIDOL 2.5 MG/ML IJ SOLN: 0.6250 mg | Freq: Once | INTRAMUSCULAR | Status: DC | PRN

## 2023-11-13 MED ORDER — LACTATED RINGERS IV SOLN: INTRAVENOUS | Status: DC

## 2023-11-13 MED ORDER — PROPOFOL 10 MG/ML IV BOLUS
INTRAVENOUS | Status: DC | PRN
  Administered 2023-11-13: 180 mg via INTRAVENOUS

## 2023-11-13 MED ORDER — MIDAZOLAM HCL 2 MG/2ML IJ SOLN
INTRAMUSCULAR | Status: AC
  Filled 2023-11-13: qty 2

## 2023-11-13 MED ORDER — INSULIN ASPART 100 UNIT/ML IJ SOLN: 0.0000 [IU] | INTRAMUSCULAR | Status: DC | PRN

## 2023-11-13 MED ORDER — HYDROMORPHONE HCL 1 MG/ML IJ SOLN
0.2500 mg | INTRAMUSCULAR | Status: DC | PRN
  Administered 2023-11-13: 0.25 mg via INTRAVENOUS

## 2023-11-13 MED ORDER — LIDOCAINE 2% (20 MG/ML) 5 ML SYRINGE
INTRAMUSCULAR | Status: DC | PRN
  Administered 2023-11-13: 80 mg via INTRAVENOUS

## 2023-11-13 MED ORDER — ONDANSETRON HCL 4 MG/2ML IJ SOLN: 4.0000 mg | Freq: Once | INTRAMUSCULAR | Status: DC | PRN

## 2023-11-13 MED ORDER — LIDOCAINE 2% (20 MG/ML) 5 ML SYRINGE
INTRAMUSCULAR | Status: AC
  Filled 2023-11-13: qty 5

## 2023-11-13 MED ORDER — OXYCODONE HCL 5 MG PO TABS: 5.0000 mg | ORAL_TABLET | Freq: Once | ORAL | Status: DC | PRN

## 2023-11-13 MED ORDER — SODIUM CHLORIDE 0.9 % IR SOLN
Status: DC | PRN
  Administered 2023-11-13: 3000 mL

## 2023-11-13 SURGICAL SUPPLY — 19 items
CATH ROBINSON RED A/P 16FR (CATHETERS) ×1 IMPLANT
DEVICE MYOSURE LITE (MISCELLANEOUS) IMPLANT
DEVICE MYOSURE REACH (MISCELLANEOUS) IMPLANT
DILATOR CANAL MILEX (MISCELLANEOUS) IMPLANT
GLOVE BIOGEL PI IND STRL 7.0 (GLOVE) IMPLANT
GLOVE BIOGEL PI IND STRL 7.5 (GLOVE) IMPLANT
GLOVE ECLIPSE 6.5 STRL STRAW (GLOVE) ×1 IMPLANT
GLOVE SURG UNDER POLY LF SZ7 (GLOVE) ×1 IMPLANT
GOWN STRL REUS W/ TWL LRG LVL3 (GOWN DISPOSABLE) ×1 IMPLANT
GOWN STRL SURGICAL XL XLNG (GOWN DISPOSABLE) IMPLANT
KIT PROCEDURE FLUENT (KITS) ×1 IMPLANT
KIT TURNOVER KIT B (KITS) ×1 IMPLANT
PACK VAGINAL MINOR WOMEN LF (CUSTOM PROCEDURE TRAY) ×1 IMPLANT
SEAL CERVICAL OMNI LOK (ABLATOR) IMPLANT
SEAL ROD LENS SCOPE MYOSURE (ABLATOR) ×1 IMPLANT
SOL PREP POV-IOD 4OZ 10% (MISCELLANEOUS) IMPLANT
SYSTEM TISS REMOVAL MYOSURE XL (MISCELLANEOUS) IMPLANT
TOWEL GREEN STERILE FF (TOWEL DISPOSABLE) ×1 IMPLANT
UNDERPAD 30X36 HEAVY ABSORB (UNDERPADS AND DIAPERS) ×1 IMPLANT

## 2023-11-13 NOTE — Discharge Instructions (Addendum)
CALL  IF TEMP>100.4, NOTHING PER VAGINA X1 WK, CALL IF SOAKING A MAXI  PAD EVERY HOUR OR MORE FREQUENTLY     Post Anesthesia Home Care Instructions  Activity: Get plenty of rest for the remainder of the day. A responsible individual must stay with you for 24 hours following the procedure.  For the next 24 hours, DO NOT: -Drive a car -Paediatric nurse -Drink alcoholic beverages -Take any medication unless instructed by your physician -Make any legal decisions or sign important papers.  Meals: Start with liquid foods such as gelatin or soup. Progress to regular foods as tolerated. Avoid greasy, spicy, heavy foods. If nausea and/or vomiting occur, drink only clear liquids until the nausea and/or vomiting subsides. Call your physician if vomiting continues.  Special Instructions/Symptoms: Your throat may feel dry or sore from the anesthesia or the breathing tube placed in your throat during surgery. If this causes discomfort, gargle with warm salt water. The discomfort should disappear within 24 hours.

## 2023-11-13 NOTE — Anesthesia Preprocedure Evaluation (Addendum)
 Anesthesia Evaluation  Patient identified by MRN, date of birth, ID band Patient awake    Reviewed: Allergy & Precautions, NPO status , Patient's Chart, lab work & pertinent test results, reviewed documented beta blocker date and time   Airway Mallampati: II  TM Distance: >3 FB     Dental no notable dental hx. (+) Teeth Intact, Dental Advisory Given, Caps   Pulmonary sleep apnea , former smoker   Pulmonary exam normal breath sounds clear to auscultation       Cardiovascular hypertension, Pt. on medications + CAD  Normal cardiovascular exam Rhythm:Regular Rate:Normal     Neuro/Psych  Neuromuscular disease  negative psych ROS   GI/Hepatic negative GI ROS, Neg liver ROS,,,  Endo/Other  diabetes, Well Controlled, Type 2  GLP-1 RA therapy- last dose 6/25 HbA1c 6.3  Renal/GU negative Renal ROS  negative genitourinary   Musculoskeletal negative musculoskeletal ROS (+)    Abdominal   Peds  Hematology negative hematology ROS (+)   Anesthesia Other Findings   Reproductive/Obstetrics PMB  Endometrial thickening                              Anesthesia Physical Anesthesia Plan  ASA: 2  Anesthesia Plan: General   Post-op Pain Management: Minimal or no pain anticipated, Dilaudid  IV, Precedex and Tylenol PO (pre-op)*   Induction: Intravenous  PONV Risk Score and Plan: 4 or greater and Treatment may vary due to age or medical condition, Midazolam , Ondansetron  and Dexamethasone   Airway Management Planned: LMA  Additional Equipment: None  Intra-op Plan:   Post-operative Plan: Extubation in OR  Informed Consent: I have reviewed the patients History and Physical, chart, labs and discussed the procedure including the risks, benefits and alternatives for the proposed anesthesia with the patient or authorized representative who has indicated his/her understanding and acceptance.     Dental  advisory given  Plan Discussed with: CRNA and Anesthesiologist  Anesthesia Plan Comments:          Anesthesia Quick Evaluation

## 2023-11-13 NOTE — Brief Op Note (Signed)
 11/13/2023  12:43 PM  PATIENT:  Sandra Sims  58 y.o. female  PRE-OPERATIVE DIAGNOSIS:  Post menopausal bleeding, endometrial mass, Enodmetrial thickening on sonogram  POST-OPERATIVE DIAGNOSIS:  Post menopausal bleeding, submucosal fibroid, Enodmetrial thickening on sonogram  PROCEDURE:  diagnostic hysteroscopy, hysteroscopic resection of submucosal fibroid using myosure, Dilation and curettage  SURGEON:  Surgeons and Role:    * Rutherford Gain, MD - Primary  PHYSICIAN ASSISTANT:   ASSISTANTS: none   ANESTHESIA:   general Finding: posterior left LUS submucosal fibroid, atropic endometrium EBL:  3 mL   BLOOD ADMINISTERED:none  DRAINS: none   LOCAL MEDICATIONS USED:  NONE  SPECIMEN:  Source of Specimen:  emc with fibroid resection  DISPOSITION OF SPECIMEN:  PATHOLOGY  COUNTS:  YES  TOURNIQUET:  * No tourniquets in log *  DICTATION: .Other Dictation: Dictation Number [id number]  PLAN OF CARE: Discharge to home after PACU  PATIENT DISPOSITION:  PACU - hemodynamically stable.   Delay start of Pharmacological VTE agent (>24hrs) due to surgical blood loss or risk of bleeding: no

## 2023-11-13 NOTE — Anesthesia Postprocedure Evaluation (Signed)
 Anesthesia Post Note  Patient: Sandra Sims  Procedure(s) Performed: DILATATION & CURETTAGE/HYSTEROSCOPY WITH RESECTOCOPE (Vagina ) MYOSURE RESECTION (Vagina )     Patient location during evaluation: PACU Anesthesia Type: General Level of consciousness: awake and alert and oriented Pain management: pain level controlled Vital Signs Assessment: post-procedure vital signs reviewed and stable Respiratory status: spontaneous breathing, nonlabored ventilation and respiratory function stable Cardiovascular status: blood pressure returned to baseline and stable Postop Assessment: no apparent nausea or vomiting Anesthetic complications: no   No notable events documented.  Last Vitals:  Vitals:   11/13/23 1342 11/13/23 1345  BP:  (!) 153/92  Pulse: (!) 56 (!) 55  Resp: 16 12  Temp:    SpO2: 100% 99%    Last Pain:  Vitals:   11/13/23 1342  TempSrc:   PainSc: 2                  Tiesha Marich A.

## 2023-11-13 NOTE — Interval H&P Note (Signed)
 History and Physical Interval Note:  11/13/2023 10:53 AM  Sandra Sims  has presented today for surgery, with the diagnosis of Post menopausal bleeding Enodmetrial thickening.  The various methods of treatment have been discussed with the patient and family. After consideration of risks, benefits and other options for treatment, the patient has consented to  diagnostic hysteroscopy, hysteroscopic resection of endometrial mass using myosure, dilation and curettage as a surgical intervention.  The patient's history has been reviewed, patient examined, no change in status, stable for surgery.  I have reviewed the patient's chart and labs.  Questions were answered to the patient's satisfaction.     Arushi Partridge A Lunabella Badgett

## 2023-11-13 NOTE — Op Note (Signed)
 Sandra Sims, LOUDIN MEDICAL RECORD NO: [id number] ACCOUNT NO: [id number] DATE OF BIRTH: 1965/09/29 FACILITY: [facility] LOCATION: [facility] PHYSICIAN: Jr Milliron A. Rutherford, MD  Operative Report   DATE OF PROCEDURE: 11/13/2023  PREOPERATIVE DIAGNOSES: 1.  Postmenopausal bleeding. 2.  Endometrial mass.  PROCEDURE:  Diagnostic hysteroscopy, hysteroscopic resection of submucosal fibroid using MyoSure, dilation and curettage.  POSTOPERATIVE DIAGNOSES: 1.  Postmenopausal bleeding. 2.  Submucosal fibroid.  ANESTHESIA:  General.  SURGEON:  Dickie Rutherford, MD.  ASSISTANT:  None.  DESCRIPTION OF PROCEDURE:  Under adequate general anesthesia, the patient was placed in the dorsal lithotomy position.  She was sterilely prepped and draped in the usual fashion.  The bladder was catheterized for a small amount of urine.  Examination  under anesthesia revealed the anteflexed uterus.  No adnexal masses could be appreciated.  A bivalve speculum was placed in the vagina.  A single-tooth tenaculum was placed on the anterior lip of the cervix.  The cervix was a pinpoint os.  Therefore, an  os finder was initially used to dilate the cervix followed by serially dilate cervix up to #21 [facility] dilator.  A diagnostic hysteroscope was introduced into the uterine cavity.  Atrophic endometrium was noted.  Both tubal ostia could be seen.  On the left  posterior lower uterine segment was about a 2 cm raised lesion suspicious for polyp; however, with the Reach resectoscope, it was then identified as a submucosal fibroid.  The mass was resected completely.  The endometrial cavity was also resected.  When  all tissue was felt to have been resected, the endocervical canal was inspected.  No lesions were noted.  All instruments were then removed from the vagina.  SPECIMEN:  Labeled endometrial curetting with fibroid resection was sent to pathology.  ESTIMATED BLOOD LOSS:  5 mL.  FLUID DEFICIT:  45  mL.  INTRAOPERATIVE FLUIDS:  600 mL.  COUNTS:  Sponge and instrument counts x2 is correct.  COMPLICATIONS:  None.  DISPOSITION:  The patient tolerated the procedure well and was transferred to the recovery room in stable condition.   PUS D: 11/13/2023 12:42:17 pm T: 11/13/2023 4:52:00 pm  JOB: [id number]/ [id number]

## 2023-11-13 NOTE — H&P (Signed)
 Sandra Sims is an 58 y.o. female. [id number] MBF presents for dx hysteroscopy, D&C, hysteroscopic resection of endometrial mass using Myosure due to PMB and  endometrial mass noted on sonohysterogram  done for endometrial thickening noted on sono eval for PMB  Pertinent Gynecological History: Menses: post-menopausal Bleeding: post menopausal bleeding Contraception: post menopausal status DES exposure: denies Blood transfusions: none Sexually transmitted diseases: no past history Previous GYN Procedures: DNC and TL  Last mammogram: normal Date: 2025 Last pap: normal Date: 2025 OB History: G3, P2   Menstrual History: Menarche age: n/a No LMP recorded (lmp unknown). Patient is postmenopausal.    Past Medical History:  Diagnosis Date   Atherosclerosis of aorta (HCC) 05/20/2021   per CTC 05-13-2021   CAD in native artery 05/20/2021   cardiology ---  dr t. 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   sleep study in epic done by dr dohmeier 01-11-2017  mild OSA AHI 5.5 insig degree to indicate cpap   PMB (postmenopausal bleeding)    Seasonal allergies    Sickle cell trait (HCC)    Type 2 diabetes mellitus (HCC)    followed by pcp:   (11-06-2023  pt stated does not check blood sugar)   Uterine fibroid    Wears glasses     Past Surgical History:  Procedure Laterality Date   COLONOSCOPY WITH PROPOFOL   07/26/2017   dr pyrtle   DILATATION & CURETTAGE/HYSTEROSCOPY WITH MYOSURE N/A 12/10/2018   Procedure: DILATATION & CURETTAGE/HYSTEROSCOPY WITH MYOSURE;  Surgeon: Rutherford Gain, MD;  Location: [facility] [place];  Service: Gynecology;  Laterality: N/A;   DILATION AND CURETTAGE OF UTERUS     yrs ago   IR GENERIC HISTORICAL  06/22/2016   IR RADIOLOGIST EVAL & MGMT 06/22/2016  [facility] INTERV RAD   LASER ABLATION OF THE CERVIX     late 1980s or early 1990s   TUBAL LIGATION Bilateral    yrs ago    Family History  Problem Relation Age of Onset   Hypertension Mother    Colon polyps Mother    Stroke Father    Colon cancer Neg Hx    Esophageal cancer Neg Hx    Rectal cancer Neg Hx    Stomach cancer Neg Hx    Liver disease Neg Hx    Pancreatic cancer Neg Hx    Prostate cancer Neg Hx    BRCA 1/2 Neg Hx    Breast cancer Neg Hx     Social History:  reports that she quit smoking about 28 years ago. Her smoking use included cigarettes. She started smoking about 39 years ago. She has a 5.5 pack-year smoking history. She has never used smokeless tobacco. She reports that she does not drink alcohol and does not use drugs.  Allergies: No Known Allergies  No medications prior to admission.    Review of Systems  All other systems reviewed and are negative.   Height 5' 4 (1.626 m), weight 81.6 kg. Physical Exam Constitutional:      Appearance: Normal appearance. She is obese.  Eyes:     Extraocular Movements: Extraocular movements intact.  Cardiovascular:     Rate and Rhythm: Regular rhythm.     Heart sounds: Normal heart sounds.  Pulmonary:  Breath sounds: Normal breath sounds.  Abdominal:     Palpations: Abdomen is soft.  Genitourinary:    General: Normal vulva.     Comments: Vagina; atrophic pale mucosa Cervix parous Uterus AV sl irregular Adnexa nl Musculoskeletal:     Cervical back: Neck supple.  Skin:    General: Skin is warm and dry.  Neurological:     General: No focal deficit present.     Mental Status: She is alert and oriented to person, place, and time.  Psychiatric:        Mood and Affect: Mood normal.        Behavior: Behavior normal.     No results found for this or any previous visit (from the past 24 hours).  No results found.  Assessment/Plan: PMB Endometrial mass P) dx hysteroscopy, D&C, hysteroscopic resection of  endometrial mass using myosure. Procedure explained. Risk of surgery reviewed including infection , bleeding, injury to surrounding organ structures, thermal injury, fluid overload and its mgmt, possible need for blood transfusion and its risk, uterine perforation ( 05/998) and its mgmt. All ? answered  Aston Lieske A Faith Branan 11/13/2023, 7:46 AM

## 2023-11-13 NOTE — Anesthesia Procedure Notes (Signed)
 Procedure Name: LMA Insertion Date/Time: 11/13/2023 12:11 PM  Performed by: Anurag Scarfo, CRNAPre-anesthesia Checklist: Patient identified, Emergency Drugs available, Suction available and Patient being monitored Patient Re-evaluated:Patient Re-evaluated prior to induction Oxygen Delivery Method: Circle System Utilized Preoxygenation: Pre-oxygenation with 100% oxygen Induction Type: IV induction Ventilation: Mask ventilation without difficulty LMA: LMA inserted LMA Size: 4.0 Number of attempts: 1 Airway Equipment and Method: Bite block Placement Confirmation: positive ETCO2 Tube secured with: Tape Dental Injury: Teeth and Oropharynx as per pre-operative assessment

## 2023-11-13 NOTE — Transfer of Care (Addendum)
 Immediate Anesthesia Transfer of Care Note  Patient: Sandra Sims  Procedure(s) Performed: DILATATION & CURETTAGE/HYSTEROSCOPY WITH RESECTOCOPE (Vagina ) MYOSURE RESECTION (Vagina )  Patient Location: PACU  Anesthesia Type:General  Level of Consciousness: awake, alert , and oriented  Airway & Oxygen Therapy: Patient Spontanous Breathing and Patient connected to face mask oxygen  Post-op Assessment: Report given to RN and Post -op Vital signs reviewed and stable  Post vital signs: Reviewed and stable  Last Vitals:  Vitals Value Taken Time  BP 135/68 11/13/23 12:50  Temp    Pulse 66 11/13/23 12:54  Resp 13 11/13/23 12:54  SpO2 100 % 11/13/23 12:54  Vitals shown include unfiled device data.  Last Pain:  Vitals:   11/13/23 1043  TempSrc: Oral  PainSc: 0-No pain      Patients Stated Pain Goal: 6 (11/13/23 1043)  Complications: No notable events documented.

## 2023-11-14 ENCOUNTER — Encounter (HOSPITAL_COMMUNITY): Payer: Self-pay | Admitting: Obstetrics and Gynecology

## 2023-11-14 LAB — SURGICAL PATHOLOGY

## 2024-01-08 ENCOUNTER — Other Ambulatory Visit (HOSPITAL_COMMUNITY): Payer: Self-pay

## 2024-02-22 ENCOUNTER — Ambulatory Visit (INDEPENDENT_AMBULATORY_CARE_PROVIDER_SITE_OTHER)

## 2024-02-22 VITALS — BP 128/80 | Temp 98.1°F | Ht 64.0 in | Wt 189.0 lb

## 2024-02-22 DIAGNOSIS — Z23 Encounter for immunization: Secondary | ICD-10-CM

## 2024-02-22 NOTE — Patient Instructions (Signed)

## 2024-02-22 NOTE — Progress Notes (Signed)
 Patient is in office today for a nurse visit for flu Immunization. Patient Injection was given in the  Right deltoid. Patient tolerated injection well.

## 2024-02-26 ENCOUNTER — Other Ambulatory Visit (HOSPITAL_COMMUNITY): Payer: Self-pay

## 2024-03-03 ENCOUNTER — Encounter: Payer: Self-pay | Admitting: Internal Medicine

## 2024-03-03 ENCOUNTER — Other Ambulatory Visit (HOSPITAL_COMMUNITY): Payer: Self-pay

## 2024-03-03 ENCOUNTER — Ambulatory Visit (INDEPENDENT_AMBULATORY_CARE_PROVIDER_SITE_OTHER): Admitting: Internal Medicine

## 2024-03-03 VITALS — BP 112/80 | HR 86 | Temp 98.4°F | Ht 64.0 in | Wt 186.4 lb

## 2024-03-03 DIAGNOSIS — M25512 Pain in left shoulder: Secondary | ICD-10-CM | POA: Diagnosis not present

## 2024-03-03 DIAGNOSIS — E66811 Obesity, class 1: Secondary | ICD-10-CM | POA: Diagnosis not present

## 2024-03-03 DIAGNOSIS — M542 Cervicalgia: Secondary | ICD-10-CM

## 2024-03-03 DIAGNOSIS — I119 Hypertensive heart disease without heart failure: Secondary | ICD-10-CM

## 2024-03-03 DIAGNOSIS — Z6832 Body mass index (BMI) 32.0-32.9, adult: Secondary | ICD-10-CM

## 2024-03-03 DIAGNOSIS — E6609 Other obesity due to excess calories: Secondary | ICD-10-CM | POA: Diagnosis not present

## 2024-03-03 DIAGNOSIS — E785 Hyperlipidemia, unspecified: Secondary | ICD-10-CM | POA: Diagnosis not present

## 2024-03-03 DIAGNOSIS — E1169 Type 2 diabetes mellitus with other specified complication: Secondary | ICD-10-CM | POA: Diagnosis not present

## 2024-03-03 MED ORDER — CYCLOBENZAPRINE HCL 10 MG PO TABS
10.0000 mg | ORAL_TABLET | Freq: Every evening | ORAL | 0 refills | Status: AC | PRN
Start: 2024-03-03 — End: ?
  Filled 2024-03-03: qty 30, 30d supply, fill #0

## 2024-03-03 MED ORDER — KETOROLAC TROMETHAMINE 60 MG/2ML IM SOLN: 60.0000 mg | Freq: Once | INTRAMUSCULAR | Status: AC

## 2024-03-03 NOTE — Assessment & Plan Note (Signed)
 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 Pain is muscular in nature. Toradol  injection preferred over steroid due to recent flu vaccination. - Administer Toradol  injection for pain relief. - Recommend Voltaren gel nightly. - Advise massage with Vicks. - Refill muscle relaxer prescription. - Encourage mobility exercises after initial pain relief. - Consider orthopedic referral if no improvement.

## 2024-03-03 NOTE — Patient Instructions (Signed)

## 2024-03-03 NOTE — Assessment & Plan Note (Signed)
 Chronic, LDL goal is less than 70.  She will continue with both rosuvastatin and semaglutide 1mg  weekly. She will rto in 3-4 months for re-evaluation. She is UTD w/ yearly eye exam.

## 2024-03-03 NOTE — Progress Notes (Signed)
 I,Sandra Sims, CMA,acting as a neurosurgeon for Sandra LOISE Slocumb, MD.,have documented all relevant documentation on the behalf of Sandra LOISE Slocumb, MD,as directed by  Sandra LOISE Slocumb, MD while in the presence of Sandra LOISE Slocumb, MD.  Subjective:  Patient ID: Sandra Sims , female    DOB: 1965-08-11 , 58 y.o.   MRN: [id number]  Chief Complaint  Patient presents with   Diabetes    Patient presents today for a DM/BP check.  She reports compliance with medications. She denies having any headaches, chest pain and shortness of breath.   She complains of left shoulder pain. Denies numbness/ tingling.    Hypertension    HPI Discussed the use of AI scribe software for clinical note transcription with the patient, who gave verbal consent to proceed.  History of Present Illness Sandra Sims is a 58 year old female with diabetes who presents for a diabetes check and left shoulder pain.  She has not been regularly monitoring her blood sugars. Her current medications include aspirin 81 mg, rosuvastatin  20 mg, Ozempic  1 mg, and valsartan  80/12.5 mg. She uses Nasonex as needed and received a flu shot last week.  She experiences left shoulder pain, which she suspects might be related to a previous issue with a frozen shoulder. The pain began about a week ago and is associated with a decreased range of motion and discomfort during activities such as driving or moving her arm in certain ways. She recalls a similar issue in the past, for which she was given a muscle relaxer. She has not undergone physical therapy or seen an orthopedic specialist for this issue. The pain is not tender but feels like it starts in the muscle and works its way up, becoming more noticeable with movement and absent at rest. No recent falls or trauma have occurred.   Diabetes She presents for her follow-up diabetic visit. She has type 2 diabetes mellitus. Her disease course has been stable. Pertinent negatives for diabetes  include no blurred vision. There are no hypoglycemic complications. Risk factors for coronary artery disease include diabetes mellitus, dyslipidemia, hypertension, obesity and post-menopausal. She is compliant with treatment most of the time. She is following a diabetic diet. She participates in exercise three times a week. An ACE inhibitor/angiotensin II receptor blocker is not being taken.  Hypertension This is a chronic problem. The current episode started more than 1 year ago. The problem has been gradually improving since onset. The problem is controlled. Pertinent negatives include no blurred vision. The current treatment provides moderate improvement. There are no compliance problems.      Past Medical History:  Diagnosis Date   Atherosclerosis of aorta 05/20/2021   per CTC 05-13-2021   CAD in native artery 05/20/2021   cardiology ---  dr t. 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   sleep study in epic done by dr dohmeier 01-11-2017  mild OSA AHI 5.5 insig degree to indicate cpap   PMB (postmenopausal bleeding)    Seasonal allergies    Sickle cell trait    Type 2 diabetes mellitus (HCC)    followed by pcp:   (11-06-2023  pt stated does not check blood sugar)   Uterine fibroid    Wears glasses  Family History  Problem Relation Age of Onset   Hypertension Mother    Colon polyps Mother    Stroke Father    Colon cancer Neg Hx    Esophageal cancer Neg Hx    Rectal cancer Neg Hx    Stomach cancer Neg Hx    Liver disease Neg Hx    Pancreatic cancer Neg Hx    Prostate cancer Neg Hx    BRCA 1/2 Neg Hx    Breast cancer Neg Hx      Current Outpatient Medications:    aspirin 81 MG chewable tablet, Chew 81 mg by mouth daily., Disp: , Rfl:    cholecalciferol (VITAMIN D ) 1000 units  tablet, Take 1,000 Units by mouth daily., Disp: , Rfl:    ibuprofen (ADVIL,MOTRIN) 800 MG tablet, Take 800 mg by mouth every 8 (eight) hours as needed., Disp: , Rfl:    mometasone (NASONEX) 50 MCG/ACT nasal spray, Place 2 sprays into the nose daily as needed. , Disp: , Rfl: 2   nystatin  powder, Apply 1 Application topically 3 (three) times daily. as needed (Patient taking differently: Apply 1 Application topically 3 (three) times daily as needed.), Disp: 45 g, Rfl: 0   Omega-3 Fatty Acids (FISH OIL PO), Take 1,000 mg by mouth daily. , Disp: , Rfl:    OVER THE COUNTER MEDICATION, Take by mouth daily. Apple cider vinegar 2 tsp daily in glass of water, Disp: , Rfl:    rosuvastatin  (CRESTOR ) 20 MG tablet, Take 1 tablet (20 mg total) by mouth daily. (Patient taking differently: Take 20 mg by mouth daily.), Disp: 90 tablet, Rfl: 2   Semaglutide , 1 MG/DOSE, (OZEMPIC , 1 MG/DOSE,) 4 MG/3ML SOPN, Inject 1 mg into the skin once a week. (Patient taking differently: Inject 1 mg into the skin once a week. Wednesday), Disp: 3 mL, Rfl: 3   valsartan -hydrochlorothiazide  (DIOVAN  HCT) 80-12.5 MG tablet, Take 1 tablet by mouth daily. (Patient taking differently: Take 1 tablet by mouth daily.), Disp: 30 tablet, Rfl: 11   vitamin C (ASCORBIC ACID) 500 MG tablet, Take 500 mg by mouth daily., Disp: , Rfl:    cyclobenzaprine  (FLEXERIL ) 10 MG tablet, Take 1 tablet (10 mg total) by mouth at bedtime as needed., Disp: 30 tablet, Rfl: 0  Current Facility-Administered Medications:    ketorolac  (TORADOL ) injection 60 mg, 60 mg, Intramuscular, Once,    No Known Allergies   Review of Systems  Constitutional: Negative.   Eyes:  Negative for blurred vision.  Respiratory: Negative.    Cardiovascular: Negative.   Neurological: Negative.   Psychiatric/Behavioral: Negative.       Today's Vitals   03/03/24 1453  BP: 112/80  Pulse: 86  Temp: 98.4 F (36.9 C)  SpO2: 98%  Weight: 186 lb 6.4 oz (84.6 kg)  Height: 5' 4 (1.626  m)   Body mass index is 32 kg/m.  Wt Readings from Last 3 Encounters:  03/03/24 186 lb 6.4 oz (84.6 kg)  02/22/24 189 lb (85.7 kg)  11/13/23 180 lb (81.6 kg)     Objective:  Physical Exam Vitals and nursing note reviewed.  Constitutional:      Appearance: Normal appearance.  HENT:     Head: Normocephalic and atraumatic.  Eyes:     Extraocular Movements: Extraocular movements intact.  Cardiovascular:     Rate and Rhythm: Normal rate and regular rhythm.     Heart sounds: Normal heart sounds.  Pulmonary:     Effort: Pulmonary effort is normal.  Breath sounds: Normal breath sounds.  Musculoskeletal:        General: Tenderness present.     Cervical back: Normal range of motion.     Comments: Decreased ROM of left shoulder  Skin:    General: Skin is warm.  Neurological:     General: No focal deficit present.     Mental Status: She is alert.  Psychiatric:        Mood and Affect: Mood normal.        Behavior: Behavior normal.      Assessment And Plan:  Dyslipidemia associated with type 2 diabetes mellitus (HCC) Assessment & Plan: Chronic, LDL goal is less than 70.  She will continue with both rosuvastatin  and semaglutide  1mg  weekly. She will rto in 3-4 months for re-evaluation. She is UTD w/ yearly eye exam.   Orders: -     CMP14+EGFR -     Hemoglobin A1c  Hypertensive heart disease without heart failure Assessment & Plan: Chronic, controlled. She will continue with valsartan /hct.  - Follow low sodium diet - Recheck in four months.     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 Pain is muscular in nature. Toradol  injection preferred over steroid due to recent flu vaccination. - Administer Toradol  injection for pain relief. - Recommend Voltaren gel nightly. - Advise massage with Vicks. - Refill muscle relaxer prescription. - Encourage mobility exercises after initial  pain relief. - Consider orthopedic referral if no improvement.  Orders: -     Ketorolac  Tromethamine  -     Cyclobenzaprine  HCl; Take 1 tablet (10 mg total) by mouth at bedtime as needed.  Dispense: 30 tablet; Refill: 0  Class 1 obesity due to excess calories with serious comorbidity and body mass index (BMI) of 32.0 to 32.9 in adult Assessment & Plan: She is encouraged to strive for BMI less than 30 to decrease cardiac risk. Advised to aim for at least 150 minutes of exercise per week.    General Health Maintenance Tetanus vaccination is due. - Schedule tetanus vaccination in two weeks.  Return in 2 weeks (on 03/17/2024), or [place] - Tdap, for 4 month dm f/u.SABRA  Patient was given opportunity to ask questions. Patient verbalized understanding of the plan and was able to repeat key elements of the plan. All questions were answered to their satisfaction.   I, Sandra LOISE Slocumb, MD, have reviewed all documentation for this visit. The documentation on 03/03/24 for the exam, diagnosis, procedures, and orders are all accurate and complete.   IF YOU HAVE BEEN REFERRED TO A SPECIALIST, IT MAY TAKE 1-2 WEEKS TO SCHEDULE/PROCESS THE REFERRAL. IF YOU HAVE NOT HEARD FROM US /SPECIALIST IN TWO WEEKS, PLEASE GIVE US  A CALL AT [phone].   THE PATIENT IS ENCOURAGED TO PRACTICE SOCIAL DISTANCING DUE TO THE COVID-19 PANDEMIC.

## 2024-03-03 NOTE — Assessment & Plan Note (Signed)
 She is encouraged to strive for BMI less than 30 to decrease cardiac risk. Advised to aim for at least 150 minutes of exercise per week.

## 2024-03-03 NOTE — Assessment & Plan Note (Signed)
 Chronic, controlled. She will continue with valsartan /hct.  - Follow low sodium diet - Recheck in four months.

## 2024-03-04 ENCOUNTER — Ambulatory Visit: Payer: Self-pay | Admitting: Internal Medicine

## 2024-03-04 LAB — CMP14+EGFR
ALT: 16 IU/L (ref 0–32)
AST: 17 IU/L (ref 0–40)
Albumin: 4.3 g/dL (ref 3.8–4.9)
Alkaline Phosphatase: 83 IU/L (ref 49–135)
BUN/Creatinine Ratio: 22 (ref 9–23)
BUN: 14 mg/dL (ref 6–24)
Bilirubin Total: 0.5 mg/dL (ref 0.0–1.2)
CO2: 26 mmol/L (ref 20–29)
Calcium: 10 mg/dL (ref 8.7–10.2)
Chloride: 100 mmol/L (ref 96–106)
Creatinine, Ser: 0.64 mg/dL (ref 0.57–1.00)
Globulin, Total: 3.1 g/dL (ref 1.5–4.5)
Glucose: 132 mg/dL — ABNORMAL HIGH (ref 70–99)
Potassium: 3.7 mmol/L (ref 3.5–5.2)
Sodium: 142 mmol/L (ref 134–144)
Total Protein: 7.4 g/dL (ref 6.0–8.5)
eGFR: 103 mL/min/1.73 (ref >59)

## 2024-03-04 LAB — HEMOGLOBIN A1C
Est. average glucose Bld gHb Est-mCnc: 143 mg/dL
Hgb A1c MFr Bld: 6.6 % — ABNORMAL HIGH (ref 4.8–5.6)

## 2024-03-18 ENCOUNTER — Ambulatory Visit: Payer: Self-pay

## 2024-03-18 VITALS — BP 120/70 | HR 98 | Temp 98.5°F | Ht 64.0 in | Wt 186.0 lb

## 2024-03-18 DIAGNOSIS — Z23 Encounter for immunization: Secondary | ICD-10-CM

## 2024-03-18 NOTE — Progress Notes (Addendum)
 Patient is in office today for a nurse visit for TDAP Immunization. Patient Injection was given in the  Right deltoid. Patient tolerated injection well.

## 2024-03-20 ENCOUNTER — Other Ambulatory Visit: Payer: Self-pay | Admitting: Internal Medicine

## 2024-03-20 ENCOUNTER — Other Ambulatory Visit: Payer: Self-pay

## 2024-03-20 ENCOUNTER — Other Ambulatory Visit (HOSPITAL_COMMUNITY): Payer: Self-pay

## 2024-03-20 MED ORDER — OZEMPIC (1 MG/DOSE) 4 MG/3ML ~~LOC~~ SOPN
1.0000 mg | PEN_INJECTOR | SUBCUTANEOUS | 3 refills | Status: AC
  Filled 2024-03-20: qty 3, 28d supply, fill #0
  Filled 2024-05-09 – 2024-05-13 (×2): qty 3, 28d supply, fill #1

## 2024-03-21 ENCOUNTER — Other Ambulatory Visit (HOSPITAL_COMMUNITY): Payer: Self-pay

## 2024-05-13 ENCOUNTER — Other Ambulatory Visit: Payer: Self-pay | Admitting: Internal Medicine

## 2024-05-13 ENCOUNTER — Other Ambulatory Visit (HOSPITAL_COMMUNITY): Payer: Self-pay

## 2024-05-13 MED ORDER — ROSUVASTATIN CALCIUM 20 MG PO TABS
20.0000 mg | ORAL_TABLET | Freq: Every day | ORAL | 2 refills | Status: AC
  Filled 2024-05-13: qty 90, 90d supply, fill #0

## 2024-05-23 ENCOUNTER — Other Ambulatory Visit (HOSPITAL_COMMUNITY): Payer: Self-pay

## 2024-07-07 ENCOUNTER — Ambulatory Visit: Payer: Self-pay | Admitting: Internal Medicine

## 2024-08-19 ENCOUNTER — Ambulatory Visit (HOSPITAL_BASED_OUTPATIENT_CLINIC_OR_DEPARTMENT_OTHER): Admitting: Cardiovascular Disease

## 2024-11-04 ENCOUNTER — Encounter: Payer: Self-pay | Admitting: Internal Medicine
